# Patient Record
Sex: Male | Born: 1965 | Race: White | Hispanic: No | Marital: Married | Smoking: Current every day smoker
Health system: Southern US, Community
[De-identification: ages and names within clinical notes are randomized; demographics above are authoritative.]

## PROBLEM LIST (undated history)

## (undated) DIAGNOSIS — I10 Essential (primary) hypertension: Secondary | ICD-10-CM

## (undated) DIAGNOSIS — W3400XA Accidental discharge from unspecified firearms or gun, initial encounter: Secondary | ICD-10-CM

## (undated) HISTORY — PX: JOINT REPLACEMENT: SHX530

---

## 2004-11-25 ENCOUNTER — Emergency Department: Payer: Self-pay | Admitting: Emergency Medicine

## 2004-12-12 ENCOUNTER — Emergency Department: Payer: Self-pay | Admitting: Emergency Medicine

## 2005-07-29 ENCOUNTER — Emergency Department: Payer: Self-pay | Admitting: General Practice

## 2006-05-11 ENCOUNTER — Emergency Department: Payer: Self-pay | Admitting: Emergency Medicine

## 2006-09-14 ENCOUNTER — Emergency Department: Payer: Self-pay | Admitting: Emergency Medicine

## 2008-09-09 ENCOUNTER — Emergency Department: Payer: Self-pay | Admitting: Emergency Medicine

## 2009-06-16 ENCOUNTER — Emergency Department: Payer: Self-pay | Admitting: Emergency Medicine

## 2009-08-05 ENCOUNTER — Emergency Department: Payer: Self-pay | Admitting: Unknown Physician Specialty

## 2009-10-20 ENCOUNTER — Ambulatory Visit: Payer: Self-pay | Admitting: Family Medicine

## 2011-09-05 ENCOUNTER — Emergency Department: Payer: Self-pay | Admitting: Emergency Medicine

## 2011-09-05 LAB — CBC
MCH: 32.7 pg (ref 26.0–34.0)
MCV: 95 fL (ref 80–100)
Platelet: 160 10*3/uL (ref 150–440)
RDW: 12.6 % (ref 11.5–14.5)
WBC: 6.5 10*3/uL (ref 3.8–10.6)

## 2011-09-05 LAB — BASIC METABOLIC PANEL
Anion Gap: 3 — ABNORMAL LOW (ref 7–16)
Calcium, Total: 8.6 mg/dL (ref 8.5–10.1)
Co2: 29 mmol/L (ref 21–32)
EGFR (African American): >60
EGFR (Non-African Amer.): >60
Glucose: 96 mg/dL (ref 65–99)
Osmolality: 273 (ref 275–301)
Potassium: 4.1 mmol/L (ref 3.5–5.1)
Sodium: 137 mmol/L (ref 136–145)

## 2011-09-05 LAB — TROPONIN I: Troponin-I: <0.02 ng/mL

## 2011-12-29 ENCOUNTER — Emergency Department: Payer: Self-pay | Admitting: Emergency Medicine

## 2011-12-31 ENCOUNTER — Emergency Department: Payer: Self-pay | Admitting: Emergency Medicine

## 2011-12-31 LAB — BASIC METABOLIC PANEL
Calcium, Total: 9 mg/dL (ref 8.5–10.1)
Chloride: 106 mmol/L (ref 98–107)
Creatinine: 0.86 mg/dL (ref 0.60–1.30)
EGFR (Non-African Amer.): >60
Glucose: 88 mg/dL (ref 65–99)
Osmolality: 276 (ref 275–301)
Potassium: 4.2 mmol/L (ref 3.5–5.1)
Sodium: 138 mmol/L (ref 136–145)

## 2011-12-31 LAB — CBC WITH DIFFERENTIAL/PLATELET
Basophil %: 1.2 %
Eosinophil #: 0.3 10*3/uL (ref 0.0–0.7)
Eosinophil %: 3.9 %
HGB: 15.1 g/dL (ref 13.0–18.0)
Lymphocyte %: 35.9 %
MCH: 33.7 pg (ref 26.0–34.0)
Monocyte #: 0.5 x10 3/mm (ref 0.2–1.0)
Monocyte %: 6.6 %
Neutrophil %: 52.4 %
Platelet: 150 10*3/uL (ref 150–440)
RBC: 4.47 10*6/uL (ref 4.40–5.90)
RDW: 12.3 % (ref 11.5–14.5)
WBC: 7.6 10*3/uL (ref 3.8–10.6)

## 2012-01-01 ENCOUNTER — Emergency Department: Payer: Self-pay | Admitting: Emergency Medicine

## 2012-01-06 LAB — CULTURE, BLOOD (SINGLE)

## 2012-01-14 ENCOUNTER — Emergency Department: Payer: Self-pay | Admitting: Internal Medicine

## 2012-04-24 ENCOUNTER — Emergency Department: Payer: Self-pay | Admitting: Emergency Medicine

## 2012-04-24 LAB — COMPREHENSIVE METABOLIC PANEL
Albumin: 3.9 g/dL (ref 3.4–5.0)
Alkaline Phosphatase: 97 U/L (ref 50–136)
Co2: 27 mmol/L (ref 21–32)
Creatinine: 0.78 mg/dL (ref 0.60–1.30)
EGFR (African American): >60
Glucose: 96 mg/dL (ref 65–99)
Potassium: 3.9 mmol/L (ref 3.5–5.1)
SGOT(AST): 66 U/L — ABNORMAL HIGH (ref 15–37)
SGPT (ALT): 121 U/L — ABNORMAL HIGH (ref 12–78)
Sodium: 138 mmol/L (ref 136–145)
Total Protein: 7 g/dL (ref 6.4–8.2)

## 2012-04-24 LAB — CBC
HGB: 14.5 g/dL (ref 13.0–18.0)
MCHC: 34.3 g/dL (ref 32.0–36.0)
MCV: 94 fL (ref 80–100)
RBC: 4.51 10*6/uL (ref 4.40–5.90)
RDW: 12.1 % (ref 11.5–14.5)

## 2012-04-24 LAB — CK TOTAL AND CKMB (NOT AT ARMC)
CK, Total: 48 U/L (ref 35–232)
CK-MB: 0.8 ng/mL (ref 0.5–3.6)

## 2012-04-24 LAB — TROPONIN I: Troponin-I: <0.02 ng/mL

## 2012-04-24 LAB — PROTIME-INR: Prothrombin Time: 13.3 secs (ref 11.5–14.7)

## 2012-04-24 IMAGING — CR DG CHEST 2V
1 series · 2 of 2 positions shown · non-contrast
Comparison: none

REASON FOR EXAM: Chest Pain
COMMENTS:

[Series 1: w chest pa · 0.14mm/px · 2 of 2 slices shown]
[im 1/2]
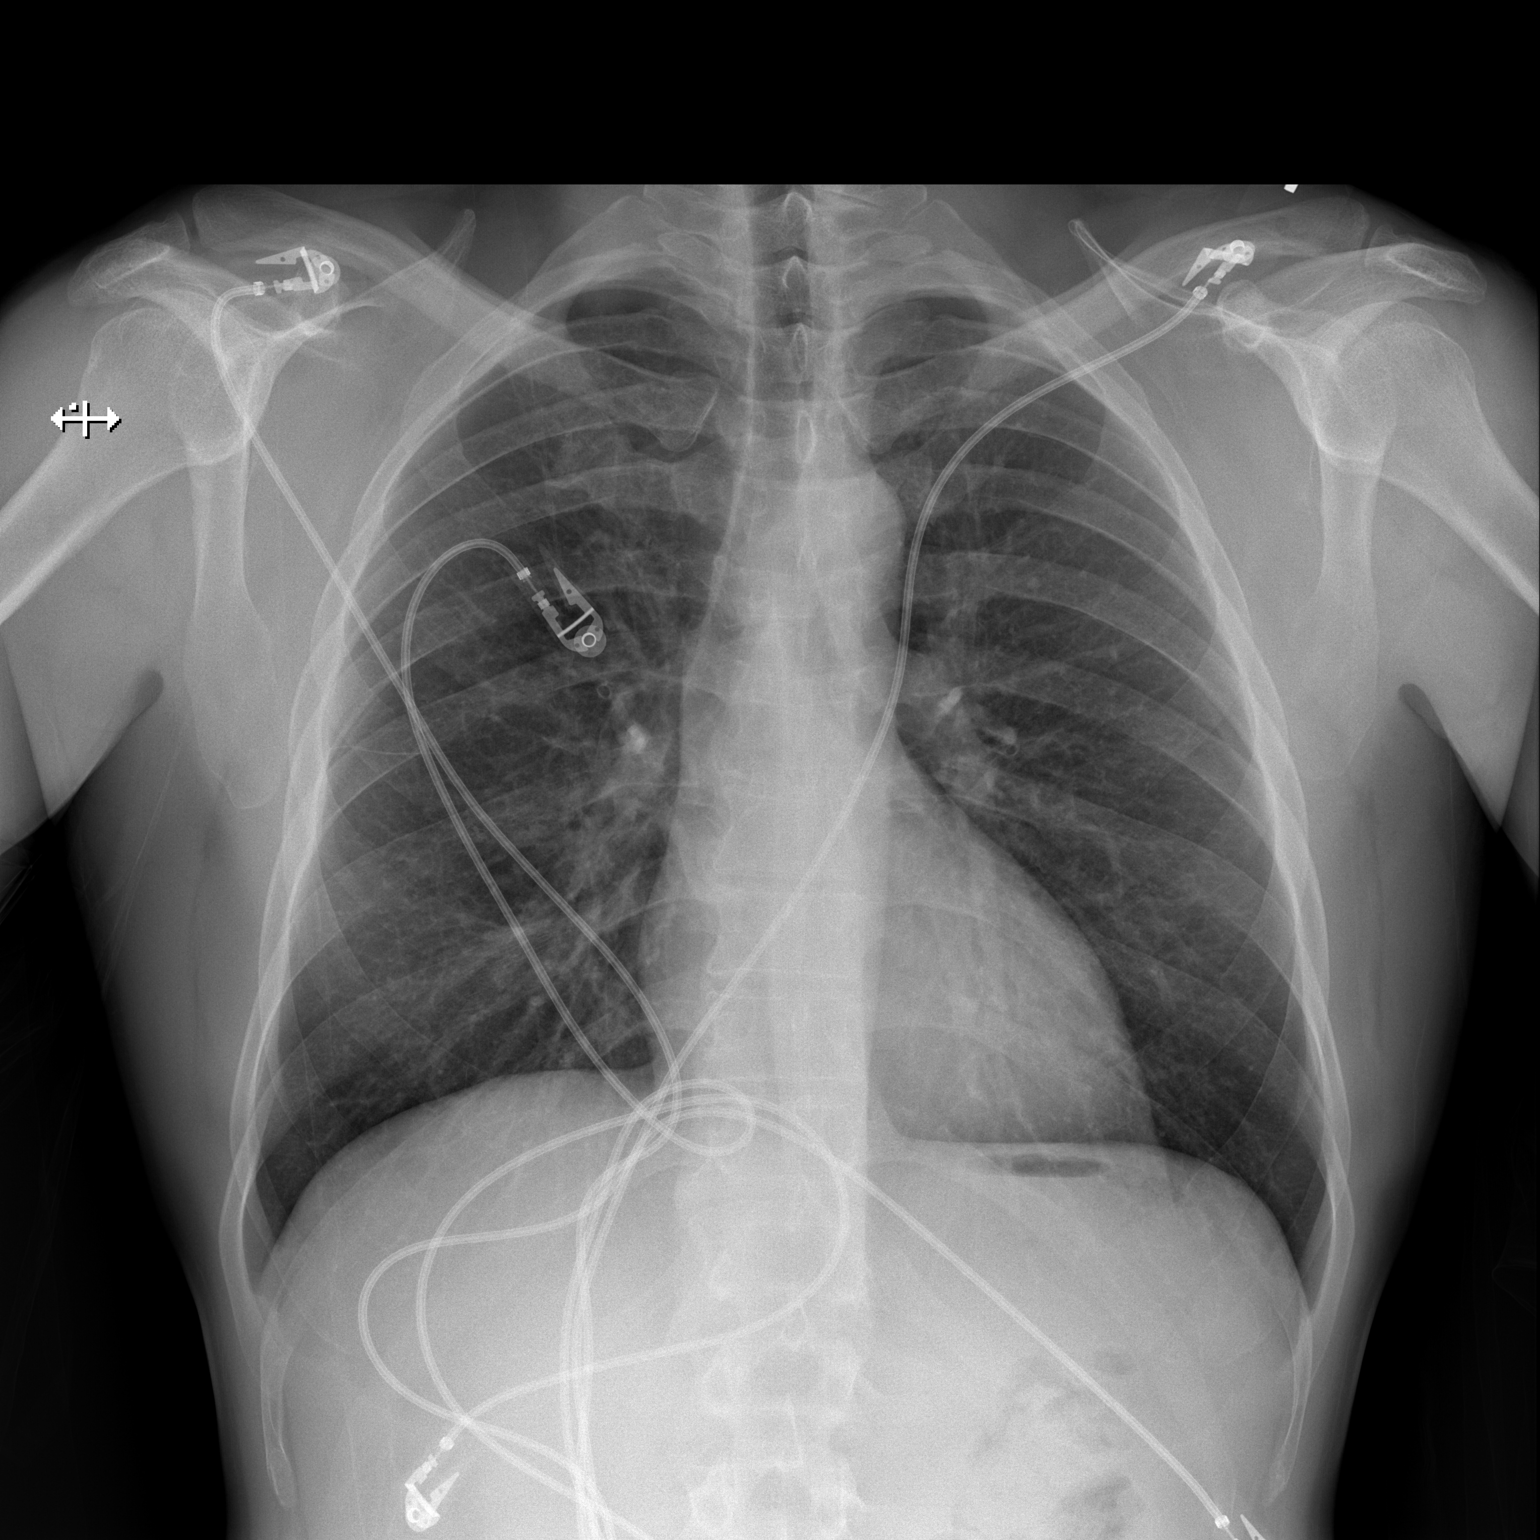
[im 2/2]
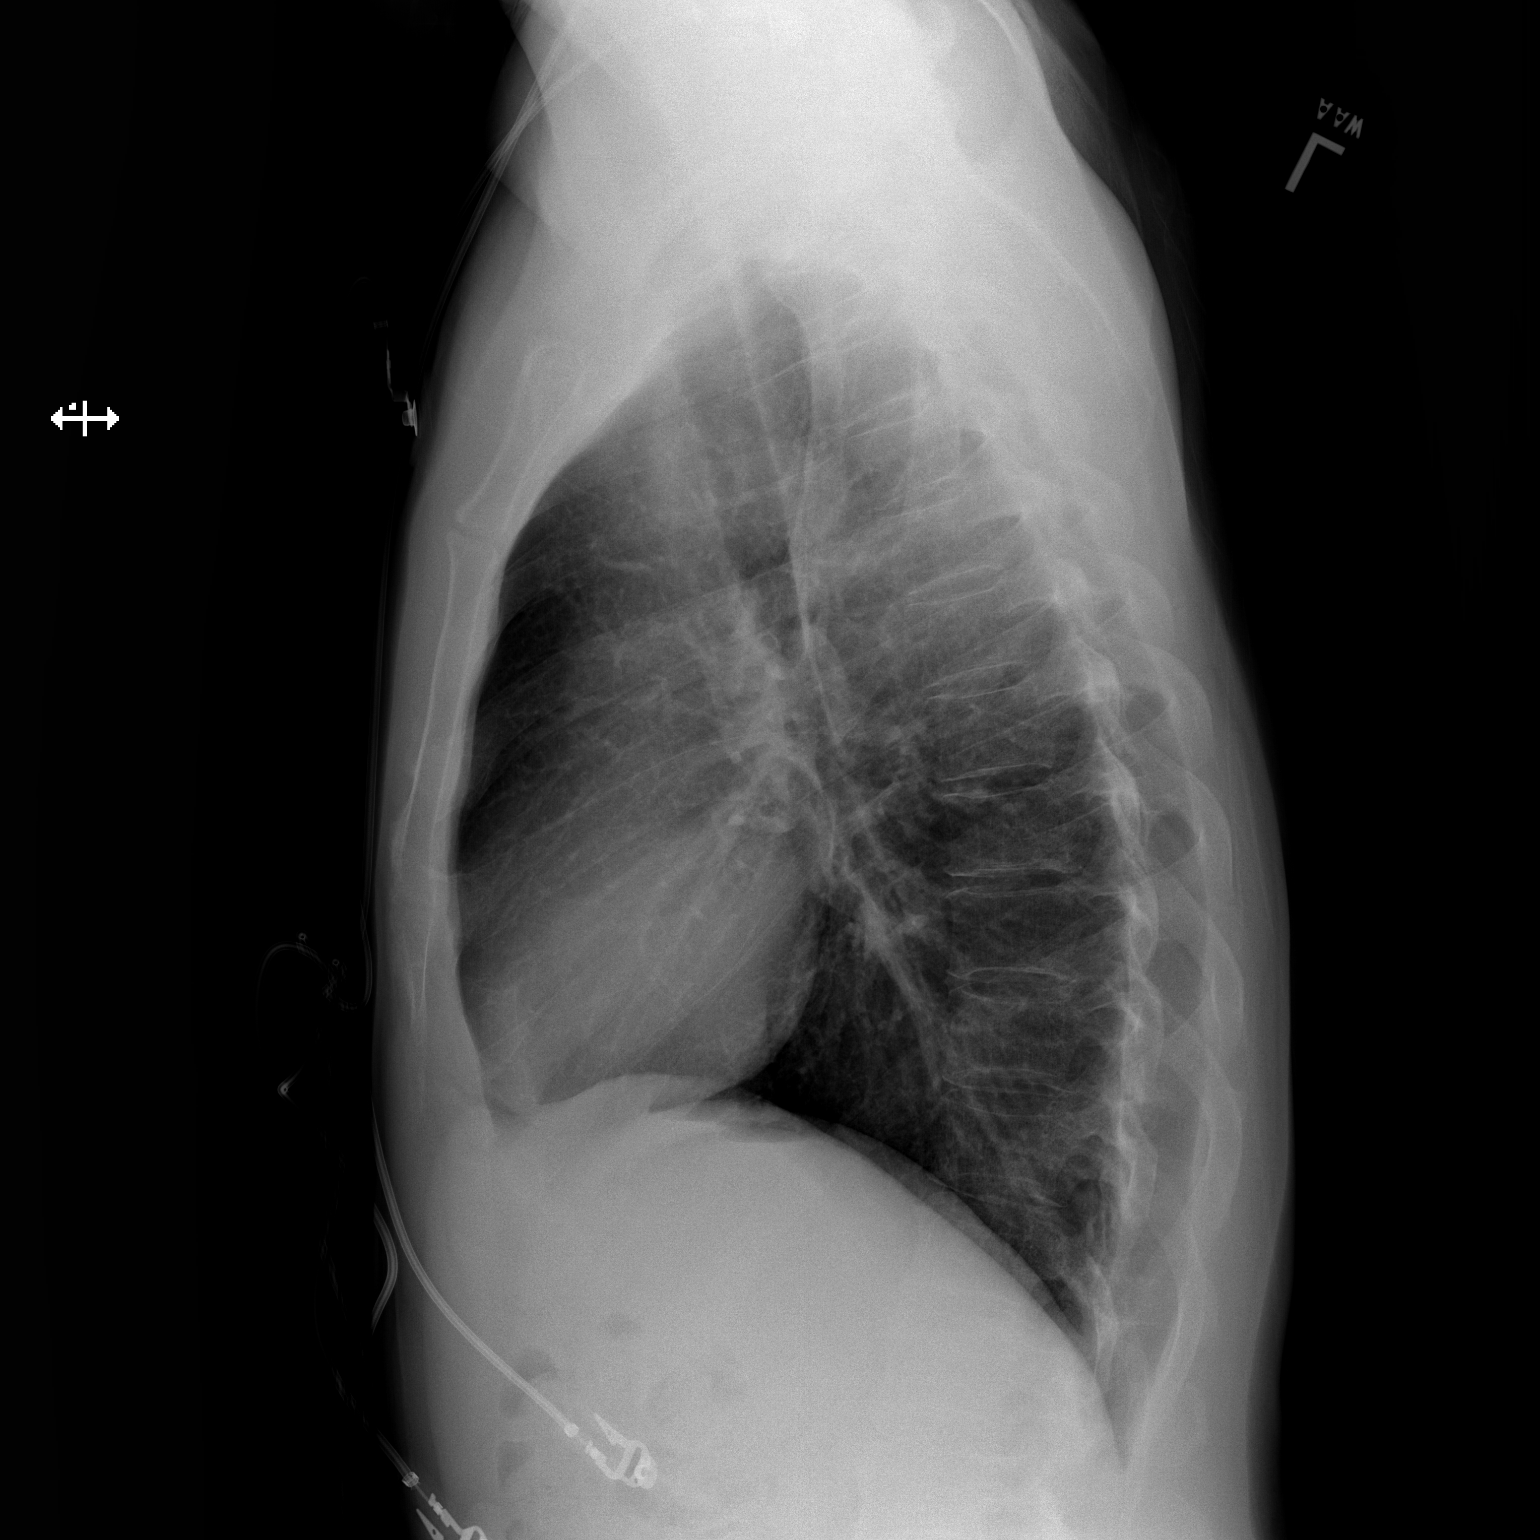

[2 of 2 positions shown; findings below may reference images not displayed]

PROCEDURE:     DXR - DXR CHEST PA (OR AP) AND LATERAL  - [DATE]  [DATE]

RESULT:     Comparison is made to the previous exam dated [DATE].

Cardiac monitoring electrodes present. The lungs are clear. The heart and
pulmonary vessels are normal. The bony and mediastinal structures are
unremarkable. There is no effusion. There is no pneumothorax or evidence of
congestive failure.
IMPRESSION: No acute cardiopulmonary disease.

[REDACTED]

## 2012-05-20 ENCOUNTER — Emergency Department: Payer: Self-pay | Admitting: Emergency Medicine

## 2012-05-20 LAB — CBC WITH DIFFERENTIAL/PLATELET
Basophil %: 0.7 %
Eosinophil #: 0.2 10*3/uL (ref 0.0–0.7)
HGB: 13.4 g/dL (ref 13.0–18.0)
Lymphocyte #: 1.4 10*3/uL (ref 1.0–3.6)
Lymphocyte %: 18.3 %
MCH: 31.9 pg (ref 26.0–34.0)
MCHC: 34.7 g/dL (ref 32.0–36.0)
MCV: 92 fL (ref 80–100)
Monocyte #: 0.6 x10 3/mm (ref 0.2–1.0)
Neutrophil #: 5.6 10*3/uL (ref 1.4–6.5)
Neutrophil %: 71.3 %
Platelet: 153 10*3/uL (ref 150–440)
RBC: 4.18 10*6/uL — ABNORMAL LOW (ref 4.40–5.90)

## 2013-08-11 ENCOUNTER — Emergency Department: Payer: Self-pay | Admitting: Internal Medicine

## 2013-08-23 ENCOUNTER — Emergency Department: Payer: Self-pay | Admitting: Emergency Medicine

## 2013-08-27 ENCOUNTER — Ambulatory Visit: Payer: Self-pay | Admitting: Gastroenterology

## 2013-08-28 ENCOUNTER — Emergency Department: Payer: Self-pay | Admitting: Emergency Medicine

## 2013-10-10 ENCOUNTER — Emergency Department: Payer: Self-pay | Admitting: Emergency Medicine

## 2013-12-31 ENCOUNTER — Ambulatory Visit: Payer: Self-pay | Admitting: Urgent Care

## 2013-12-31 LAB — HEPATIC FUNCTION PANEL A (ARMC)
ALBUMIN: 3.9 g/dL (ref 3.4–5.0)
ALK PHOS: 132 U/L — AB
BILIRUBIN TOTAL: 0.3 mg/dL (ref 0.2–1.0)
Bilirubin, Direct: <0.1 mg/dL (ref 0.0–0.2)
SGOT(AST): 12 U/L — ABNORMAL LOW (ref 15–37)
SGPT (ALT): 15 U/L
TOTAL PROTEIN: 7.3 g/dL (ref 6.4–8.2)

## 2015-05-13 ENCOUNTER — Emergency Department
Admission: EM | Admit: 2015-05-13 | Discharge: 2015-05-14 | Disposition: A | Discharge status: Home / Self Care | Payer: Managed Care, Other (non HMO) | Attending: Emergency Medicine | Admitting: Emergency Medicine

## 2015-05-13 ENCOUNTER — Encounter: Payer: Self-pay | Admitting: *Deleted

## 2015-05-13 DIAGNOSIS — R1013 Epigastric pain: Secondary | ICD-10-CM | POA: Diagnosis present

## 2015-05-13 DIAGNOSIS — K859 Acute pancreatitis without necrosis or infection, unspecified: Secondary | ICD-10-CM | POA: Diagnosis not present

## 2015-05-13 LAB — CBC
HCT: 37.3 % — ABNORMAL LOW (ref 40.0–52.0)
HEMOGLOBIN: 12.9 g/dL — AB (ref 13.0–18.0)
MCH: 31.9 pg (ref 26.0–34.0)
MCHC: 34.7 g/dL (ref 32.0–36.0)
MCV: 91.8 fL (ref 80.0–100.0)
PLATELETS: 133 10*3/uL — AB (ref 150–440)
RBC: 4.06 MIL/uL — ABNORMAL LOW (ref 4.40–5.90)
RDW: 13.1 % (ref 11.5–14.5)
WBC: 5.6 10*3/uL (ref 3.8–10.6)

## 2015-05-13 LAB — URINALYSIS COMPLETE WITH MICROSCOPIC (ARMC ONLY)
BILIRUBIN URINE: NEGATIVE
Bacteria, UA: NONE SEEN
GLUCOSE, UA: NEGATIVE mg/dL
Hgb urine dipstick: NEGATIVE
KETONES UR: NEGATIVE mg/dL
Leukocytes, UA: NEGATIVE
NITRITE: NEGATIVE
PH: 5 (ref 5.0–8.0)
Protein, ur: NEGATIVE mg/dL
Specific Gravity, Urine: 1.027 (ref 1.005–1.030)
Squamous Epithelial / LPF: NONE SEEN

## 2015-05-13 LAB — COMPREHENSIVE METABOLIC PANEL
ALBUMIN: 4.2 g/dL (ref 3.5–5.0)
ALT: 12 U/L — ABNORMAL LOW (ref 17–63)
ANION GAP: 6 (ref 5–15)
AST: 20 U/L (ref 15–41)
Alkaline Phosphatase: 79 U/L (ref 38–126)
BILIRUBIN TOTAL: 0.6 mg/dL (ref 0.3–1.2)
BUN: 26 mg/dL — ABNORMAL HIGH (ref 6–20)
CALCIUM: 9.4 mg/dL (ref 8.9–10.3)
CO2: 30 mmol/L (ref 22–32)
CREATININE: 1.08 mg/dL (ref 0.61–1.24)
Chloride: 101 mmol/L (ref 101–111)
GFR calc Af Amer: >60 mL/min (ref >60)
GFR calc non Af Amer: >60 mL/min (ref >60)
GLUCOSE: 113 mg/dL — AB (ref 65–99)
POTASSIUM: 5.3 mmol/L — AB (ref 3.5–5.1)
SODIUM: 137 mmol/L (ref 135–145)
TOTAL PROTEIN: 6.8 g/dL (ref 6.5–8.1)

## 2015-05-13 LAB — TROPONIN I: Troponin I: <0.03 ng/mL (ref <0.031)

## 2015-05-13 LAB — LIPASE, BLOOD: Lipase: 64 U/L — ABNORMAL HIGH (ref 11–51)

## 2015-05-13 NOTE — ED Provider Notes (Signed)
[facility] Emergency Department Provider Note  ____________________________________________  Time seen: Approximately 11:53 PM  I have reviewed the triage vital signs and the nursing notes.   HISTORY  Chief Complaint Abdominal Pain and Diarrhea    HPI Devin RichardsGary P Lewis is a 50 y.o. male who presents to the ED from home with a chief complaint of abdominal pain. Patient reports sharp, nonradiating epigastric discomfort onset today approximately 2 PM after eating pizza. Symptoms associated with nausea, diarrhea but no vomiting. Denies history of same. Spouse states patient has a history of heavy alcohol use. Denies recent fever, chills, chest pain, shortness of breath. Denies recent travel or trauma. Nothing makes this pain better or worse.   Past medical history None  There are no active problems to display for this patient.   Past surgical history None for cholecystectomy  No current outpatient prescriptions on file.  Allergies Review of patient's allergies indicates no known allergies.  No family history on file.  Social History Social History  Substance Use Topics  . Smoking status: Never Smoker   . Smokeless tobacco: Not on file  . Alcohol Use: No  +Etoh  Review of Systems  Constitutional: No fever/chills. Eyes: No visual changes. ENT: No sore throat. Cardiovascular: Denies chest pain. Respiratory: Denies shortness of breath. Gastrointestinal: Positive for abdominal pain. Positive for nausea, no vomiting.  Positive for diarrhea.  No constipation. Genitourinary: Negative for dysuria. Musculoskeletal: Negative for back pain. Skin: Negative for rash. Neurological: Negative for headaches, focal weakness or numbness.  10-point ROS otherwise negative.  ____________________________________________   PHYSICAL EXAM:  VITAL SIGNS: ED Triage Vitals  Enc Vitals Group     BP 05/13/15 2053 118/72 mmHg     Pulse Rate 05/13/15 2053 70   Resp 05/13/15 2053 20     Temp 05/13/15 2053 97.6 F (36.4 C)     Temp Source 05/13/15 2053 Oral     SpO2 05/13/15 2053 99 %     Weight 05/13/15 2053 135 lb (61.236 kg)     Height 05/13/15 2053 5\' 5"  (1.651 m)     Head Cir --      Peak Flow --      Pain Score 05/13/15 2054 10     Pain Loc --      Pain Edu? --      Excl. in GC? --     Constitutional: Asleep, easily awakened for exam. Alert and oriented. Well appearing and in no acute distress. Eyes: Conjunctivae are normal. PERRL. EOMI. Head: Atraumatic. Nose: No congestion/rhinnorhea. Mouth/Throat: Mucous membranes are moist.  Oropharynx non-erythematous. Neck: No stridor.   Cardiovascular: Normal rate, regular rhythm. Grossly normal heart sounds.  Good peripheral circulation. Respiratory: Normal respiratory effort.  No retractions. Lungs CTAB. Gastrointestinal: Soft and mildly tender to palpation epigastrium without rebound or guarding. No distention. No abdominal bruits. No CVA tenderness. Musculoskeletal: No lower extremity tenderness nor edema.  No joint effusions. Neurologic:  Normal speech and language. No gross focal neurologic deficits are appreciated. No gait instability. Skin:  Skin is warm, dry and intact. No rash noted. Psychiatric: Mood and affect are normal. Speech and behavior are normal.  ____________________________________________   LABS (all labs ordered are listed, but only abnormal results are displayed)  Labs Reviewed  LIPASE, BLOOD - Abnormal; Notable for the following:    Lipase 64 (*)    All other components within normal limits  COMPREHENSIVE METABOLIC PANEL - Abnormal; Notable for the following:    Potassium 5.3 (*)  Glucose, Bld 113 (*)    BUN 26 (*)    ALT 12 (*)    All other components within normal limits  CBC - Abnormal; Notable for the following:    RBC 4.06 (*)    Hemoglobin 12.9 (*)    HCT 37.3 (*)    Platelets 133 (*)    All other components within normal limits  URINALYSIS  COMPLETEWITH MICROSCOPIC ([facility] ONLY) - Abnormal; Notable for the following:    Color, Urine YELLOW (*)    APPearance CLEAR (*)    All other components within normal limits  TROPONIN I  ETHANOL   ____________________________________________  EKG  ED ECG REPORT I, Verl Whitmore J, the attending physician, personally viewed and interpreted this ECG.   Date: 05/14/2015  EKG Time: 2058  Rate: 67  Rhythm: normal EKG, normal sinus rhythm  Axis: Normal  Intervals:none  ST&T Change: Nonspecific  ____________________________________________  RADIOLOGY  Ultrasound abdomen interpreted per Dr. Andria Meuse: Normal examination. ____________________________________________   PROCEDURES  Procedure(s) performed: None  Critical Care performed: No  ____________________________________________   INITIAL IMPRESSION / ASSESSMENT AND PLAN / ED COURSE  Pertinent labs & imaging results that were available during my care of the patient were reviewed by me and considered in my medical decision making (see chart for details).  50 year old male who presents with epigastric discomfort in the setting of pizza and alcohol use. Laboratory and urinalysis results remarkable for mildly elevated lipase and mild hyperkalemia without EKG changes. Will initiate IV fluid resuscitation, IV analgesia and antiemetic; obtain limited RUQ ultrasound to evaluate for cholecystitis.  ----------------------------------------- 1:08 AM on 05/14/2015 -----------------------------------------  Patient sleeping. Awakened to update of negative imaging studies. Will manage mild pancreatitis with analgesia and antiemetic as needed; follow-up with his PCP. Strict return precautions given. Patient spouse verbalize understanding and agree with plan of care. ____________________________________________   FINAL CLINICAL IMPRESSION(S) / ED DIAGNOSES  Final diagnoses:  Epigastric pain  Acute pancreatitis, unspecified pancreatitis  type      Irean Hong, MD 05/14/15 [phone]

## 2015-05-13 NOTE — ED Notes (Signed)
Pt in with co upper abd pain since today, no vomiting but does have some diarrhea.

## 2015-05-14 ENCOUNTER — Encounter: Payer: Self-pay | Admitting: *Deleted

## 2015-05-14 ENCOUNTER — Emergency Department: Payer: Managed Care, Other (non HMO)

## 2015-05-14 ENCOUNTER — Emergency Department
Admission: EM | Admit: 2015-05-14 | Discharge: 2015-05-14 | Disposition: A | Discharge status: Home / Self Care | Payer: Managed Care, Other (non HMO) | Source: Home / Self Care | Attending: Emergency Medicine | Admitting: Emergency Medicine

## 2015-05-14 DIAGNOSIS — F17200 Nicotine dependence, unspecified, uncomplicated: Secondary | ICD-10-CM | POA: Insufficient documentation

## 2015-05-14 DIAGNOSIS — R1013 Epigastric pain: Secondary | ICD-10-CM

## 2015-05-14 DIAGNOSIS — K2970 Gastritis, unspecified, without bleeding: Secondary | ICD-10-CM

## 2015-05-14 LAB — COMPREHENSIVE METABOLIC PANEL
ALT: 12 U/L — AB (ref 17–63)
AST: 19 U/L (ref 15–41)
Albumin: 4.2 g/dL (ref 3.5–5.0)
Alkaline Phosphatase: 73 U/L (ref 38–126)
Anion gap: 7 (ref 5–15)
BILIRUBIN TOTAL: 0.5 mg/dL (ref 0.3–1.2)
BUN: 24 mg/dL — AB (ref 6–20)
CO2: 29 mmol/L (ref 22–32)
CREATININE: 1.11 mg/dL (ref 0.61–1.24)
Calcium: 9 mg/dL (ref 8.9–10.3)
Chloride: 104 mmol/L (ref 101–111)
Glucose, Bld: 82 mg/dL (ref 65–99)
POTASSIUM: 4.7 mmol/L (ref 3.5–5.1)
Sodium: 140 mmol/L (ref 135–145)
Total Protein: 6.8 g/dL (ref 6.5–8.1)

## 2015-05-14 LAB — CBC
HEMATOCRIT: 39.3 % — AB (ref 40.0–52.0)
Hemoglobin: 13.5 g/dL (ref 13.0–18.0)
MCH: 32 pg (ref 26.0–34.0)
MCHC: 34.4 g/dL (ref 32.0–36.0)
MCV: 93.2 fL (ref 80.0–100.0)
Platelets: 140 10*3/uL — ABNORMAL LOW (ref 150–440)
RBC: 4.22 MIL/uL — ABNORMAL LOW (ref 4.40–5.90)
RDW: 12.6 % (ref 11.5–14.5)
WBC: 6.7 10*3/uL (ref 3.8–10.6)

## 2015-05-14 LAB — ETHANOL

## 2015-05-14 LAB — LIPASE, BLOOD: Lipase: 22 U/L (ref 11–51)

## 2015-05-14 MED ORDER — ONDANSETRON HCL 4 MG/2ML IJ SOLN
4.0000 mg | Freq: Once | INTRAMUSCULAR | Status: AC
  Administered 2015-05-14: 4 mg via INTRAVENOUS
  Filled 2015-05-14: qty 2

## 2015-05-14 MED ORDER — SODIUM CHLORIDE 0.9 % IV BOLUS (SEPSIS)
1000.0000 mL | Freq: Once | INTRAVENOUS | Status: AC
  Administered 2015-05-14: 1000 mL via INTRAVENOUS

## 2015-05-14 MED ORDER — ONDANSETRON 4 MG PO TBDP: 4.0000 mg | ORAL_TABLET | Freq: Three times a day (TID) | ORAL | Status: DC | PRN

## 2015-05-14 MED ORDER — PANTOPRAZOLE SODIUM 40 MG PO TBEC
80.0000 mg | DELAYED_RELEASE_TABLET | Freq: Once | ORAL | Status: AC
  Administered 2015-05-14: 80 mg via ORAL
  Filled 2015-05-14: qty 2

## 2015-05-14 MED ORDER — GI COCKTAIL ~~LOC~~
30.0000 mL | Freq: Once | ORAL | Status: AC
  Administered 2015-05-14: 30 mL via ORAL
  Filled 2015-05-14: qty 30

## 2015-05-14 MED ORDER — SODIUM CHLORIDE 0.9 % IV BOLUS (SEPSIS)
500.0000 mL | Freq: Once | INTRAVENOUS | Status: AC
  Administered 2015-05-14: 500 mL via INTRAVENOUS

## 2015-05-14 MED ORDER — OXYCODONE-ACETAMINOPHEN 5-325 MG PO TABS: 1.0000 | ORAL_TABLET | ORAL | Status: DC | PRN

## 2015-05-14 MED ORDER — PANTOPRAZOLE SODIUM 20 MG PO TBEC: 20.0000 mg | DELAYED_RELEASE_TABLET | Freq: Every day | ORAL | Status: DC

## 2015-05-14 MED ORDER — MORPHINE SULFATE (PF) 4 MG/ML IV SOLN
4.0000 mg | Freq: Once | INTRAVENOUS | Status: AC
  Administered 2015-05-14: 2 mg via INTRAVENOUS
  Filled 2015-05-14: qty 1

## 2015-05-14 NOTE — ED Notes (Signed)
Pt was seen in er last night and dx with pancreatitis. Pt states pain is no better and pain meds are not helping.  Pt reports v/d today.  Pt alert.

## 2015-05-14 NOTE — ED Notes (Signed)
Pt returned from scans via stretcher.  

## 2015-05-14 NOTE — ED Provider Notes (Signed)
Time Seen: Approximately 2123 I have reviewed the triage notes  Chief Complaint: Abdominal Pain   History of Present Illness: Devin Lewis is a 50 y.o. male who presents with persistent epigastric abdominal pain. Patient was seen 2 days ago here in emergency department had recent history of alcohol usage and had an elevated lipase. He was diagnosed with pancreatitis and discharged with pain medication. Patient states that he is taking the medication nose had increased abdominal pain. He exclusively points to the epigastric area and denies any lower abdominal pain. Eyes any melena or hematochezia. He states he's abraded alcohol consumption.   No past medical history on file.  There are no active problems to display for this patient.   No past surgical history on file.  No past surgical history on file.  Current Outpatient Rx  Name  Route  Sig  Dispense  Refill  . ondansetron (ZOFRAN ODT) 4 MG disintegrating tablet   Oral   Take 1 tablet (4 mg total) by mouth every 8 (eight) hours as needed for nausea or vomiting.   15 tablet   0   . oxyCODONE-acetaminophen (ROXICET) 5-325 MG tablet   Oral   Take 1 tablet by mouth every 4 (four) hours as needed for severe pain.   15 tablet   0     Allergies:  Review of patient's allergies indicates no known allergies.  Family History: No family history on file.  Social History: Social History  Substance Use Topics  . Smoking status: Current Every Day Smoker  . Smokeless tobacco: None  . Alcohol Use: Yes     Review of Systems:   10 point review of systems was performed and was otherwise negative:  Constitutional: No fever Eyes: No visual disturbances ENT: No sore throat, ear pain Cardiac: No chest pain Respiratory: No shortness of breath, wheezing, or stridor Abdomen: Epigastric abdominal pain, no vomiting, no melena or hematochezia Endocrine: No weight loss, No night sweats Extremities: No peripheral edema,  cyanosis Skin: No rashes, easy bruising Neurologic: No focal weakness, trouble with speech or swollowing Urologic: No dysuria, Hematuria, or urinary frequency   Physical Exam:  ED Triage Vitals  Enc Vitals Group     BP 05/14/15 1722 100/75 mmHg     Pulse Rate 05/14/15 1722 99     Resp 05/14/15 1722 18     Temp 05/14/15 1722 98.4 F (36.9 C)     Temp Source 05/14/15 1722 Oral     SpO2 05/14/15 1722 97 %     Weight 05/14/15 1722 135 lb (61.236 kg)     Height 05/14/15 1722 5\' 5"  (1.651 m)     Head Cir --      Peak Flow --      Pain Score 05/14/15 1722 10     Pain Loc --      Pain Edu? --      Excl. in GC? --     General: Awake , Alert , and Oriented times 3; GCS 15 Head: Normal cephalic , atraumatic Eyes: Pupils equal , round, reactive to light Nose/Throat: No nasal drainage, patent upper airway without erythema or exudate.  Neck: Supple, Full range of motion, No anterior adenopathy or palpable thyroid masses Lungs: Clear to ascultation without wheezes , rhonchi, or rales Heart: Regular rate, regular rhythm without murmurs , gallops , or rubs Abdomen: Tender exclusively in the epigastric area with a negative Murphy's sign and no rebound, guarding or rigidity. No focal tenderness over  McBurney's point. Extremities: 2 plus symmetric pulses. No edema, clubbing or cyanosis Neurologic: normal ambulation, Motor symmetric without deficits, sensory intact Skin: warm, dry, no rashes   Labs:   All laboratory work was reviewed including any pertinent negatives or positives listed below:  Labs Reviewed  COMPREHENSIVE METABOLIC PANEL - Abnormal; Notable for the following:    BUN 24 (*)    ALT 12 (*)    All other components within normal limits  CBC - Abnormal; Notable for the following:    RBC 4.22 (*)    HCT 39.3 (*)    Platelets 140 (*)    All other components within normal limits  LIPASE, BLOOD   Reviewed the patient's laboratory work shows no significant findings. Stable  hemoglobin Radiology:      CLINICAL DATA: Diagnosed with pancreatitis last night. Persistent pain, vomiting, and diarrhea.  EXAM: DG ABDOMEN ACUTE W/ 1V CHEST  COMPARISON: 04/24/2012  FINDINGS: Normal heart size and pulmonary vascularity. No focal airspace disease or consolidation in the lungs. No blunting of costophrenic angles. No pneumothorax. Mediastinal contours appear intact.  Scattered gas and stool in the colon. No small or large bowel distention. No free intra-abdominal air. No abnormal air-fluid levels. No radiopaque stones. Postoperative changes in the left hip.  IMPRESSION: Negative abdominal radiographs. No acute cardiopulmonary disease.   Electronically Signed By: Burman Nieves M.D.  I personally reviewed the radiologic studies   P ED Course:  Patient's differential included pancreatitis, however his lipase seems to be decreasing at this time. The patient's differential also includes gastritis, duodenal ulcer disease, etc. Patient's hemoglobin is stable and denies any melena or hematochezia, etc.   Assessment: * Acute epigastric abdominal pain   Final Clinical Impression:   Final diagnoses:  Epigastric abdominal pain     Plan:  Outpatient management Patient was advised to return immediately if condition worsens. Patient was advised to follow up with their primary care physician or other specialized physicians involved in their outpatient care             Jennye Moccasin, MD 05/14/15 2339

## 2015-05-14 NOTE — Discharge Instructions (Signed)
Gastritis, Adult °Gastritis is soreness and swelling (inflammation) of the lining of the stomach. Gastritis can develop as a sudden onset (acute) or long-term (chronic) condition. If gastritis is not treated, it can lead to stomach bleeding and ulcers. °CAUSES  °Gastritis occurs when the stomach lining is weak or damaged. Digestive juices from the stomach then inflame the weakened stomach lining. The stomach lining may be weak or damaged due to viral or bacterial infections. One common bacterial infection is the Helicobacter pylori infection. Gastritis can also result from excessive alcohol consumption, taking certain medicines, or having too much acid in the stomach.  °SYMPTOMS  °In some cases, there are no symptoms. When symptoms are present, they may include: °· Pain or a burning sensation in the upper abdomen. °· Nausea. °· Vomiting. °· An uncomfortable feeling of fullness after eating. °DIAGNOSIS  °Your caregiver may suspect you have gastritis based on your symptoms and a physical exam. To determine the cause of your gastritis, your caregiver may perform the following: °· Blood or stool tests to check for the H pylori bacterium. °· Gastroscopy. A thin, flexible tube (endoscope) is passed down the esophagus and into the stomach. The endoscope has a light and camera on the end. Your caregiver uses the endoscope to view the inside of the stomach. °· Taking a tissue sample (biopsy) from the stomach to examine under a microscope. °TREATMENT  °Depending on the cause of your gastritis, medicines may be prescribed. If you have a bacterial infection, such as an H pylori infection, antibiotics may be given. If your gastritis is caused by too much acid in the stomach, H2 blockers or antacids may be given. Your caregiver may recommend that you stop taking aspirin, ibuprofen, or other nonsteroidal anti-inflammatory drugs (NSAIDs). °HOME CARE INSTRUCTIONS °· Only take over-the-counter or prescription medicines as directed by  your caregiver. °· If you were given antibiotic medicines, take them as directed. Finish them even if you start to feel better. °· Drink enough fluids to keep your urine clear or pale yellow. °· Avoid foods and drinks that make your symptoms worse, such as: °¨ Caffeine or alcoholic drinks. °¨ Chocolate. °¨ Peppermint or mint flavorings. °¨ Garlic and onions. °¨ Spicy foods. °¨ Citrus fruits, such as oranges, lemons, or limes. °¨ Tomato-based foods such as sauce, chili, salsa, and pizza. °¨ Fried and fatty foods. °· Eat small, frequent meals instead of large meals. °SEEK IMMEDIATE MEDICAL CARE IF:  °· You have black or dark red stools. °· You vomit blood or material that looks like coffee grounds. °· You are unable to keep fluids down. °· Your abdominal pain gets worse. °· You have a fever. °· You do not feel better after 1 week. °· You have any other questions or concerns. °MAKE SURE YOU: °· Understand these instructions. °· Will watch your condition. °· Will get help right away if you are not doing well or get worse. °  °This information is not intended to replace advice given to you by your health care provider. Make sure you discuss any questions you have with your health care provider. °  °Document Released: 02/16/2001 Document Revised: 08/24/2011 Document Reviewed: 04/07/2011 °Elsevier Interactive Patient Education ©2016 Elsevier Inc. ° ° °Please return immediately if condition worsens. Please contact her primary physician or the physician you were given for referral. If you have any specialist physicians involved in her treatment and plan please also contact them. Thank you for using [facility]. ° °

## 2015-05-14 NOTE — ED Notes (Signed)
Discussed IV options with patient, AC placement preferred. 

## 2015-05-14 NOTE — Discharge Instructions (Signed)
1. You may take pain & nausea medicines as needed (Percocet/Zofran #15). 2. Parke Simmers diet as tolerated x 5 days, then slowly advance diet as tolerated. 3. Return to the ER for worsening symptoms, persistent vomiting, fever or other concerns.  Abdominal Pain, Adult Many things can cause abdominal pain. Usually, abdominal pain is not caused by a disease and will improve without treatment. It can often be observed and treated at home. Your health care provider will do a physical exam and possibly order blood tests and X-rays to help determine the seriousness of your pain. However, in many cases, more time must pass before a clear cause of the pain can be found. Before that point, your health care provider may not know if you need more testing or further treatment. HOME CARE INSTRUCTIONS Monitor your abdominal pain for any changes. The following actions may help to alleviate any discomfort you are experiencing:  Only take over-the-counter or prescription medicines as directed by your health care provider.  Do not take laxatives unless directed to do so by your health care provider.  Try a clear liquid diet (broth, tea, or water) as directed by your health care provider. Slowly move to a bland diet as tolerated. SEEK MEDICAL CARE IF:  You have unexplained abdominal pain.  You have abdominal pain associated with nausea or diarrhea.  You have pain when you urinate or have a bowel movement.  You experience abdominal pain that wakes you in the night.  You have abdominal pain that is worsened or improved by eating food.  You have abdominal pain that is worsened with eating fatty foods.  You have a fever. SEEK IMMEDIATE MEDICAL CARE IF:  Your pain does not go away within 2 hours.  You keep throwing up (vomiting).  Your pain is felt only in portions of the abdomen, such as the right side or the left lower portion of the abdomen.  You pass bloody or black tarry stools. MAKE SURE  YOU:  Understand these instructions.  Will watch your condition.  Will get help right away if you are not doing well or get worse.   This information is not intended to replace advice given to you by your health care provider. Make sure you discuss any questions you have with your health care provider.   Document Released: 12/02/2004 Document Revised: 11/13/2014 Document Reviewed: 11/01/2012 Elsevier Interactive Patient Education 2016 Else[employer] Inc.  Acute Pancreatitis Acute pancreatitis is a disease in which the pancreas becomes suddenly inflamed. The pancreas is a large gland located behind your stomach. The pancreas produces enzymes that help digest food. The pancreas also releases the hormones glucagon and insulin that help regulate blood sugar. Damage to the pancreas occurs when the digestive enzymes from the pancreas are activated and begin attacking the pancreas before being released into the intestine. Most acute attacks last a couple of days and can cause serious complications. Some people become dehydrated and develop low blood pressure. In severe cases, bleeding into the pancreas can lead to shock and can be life-threatening. The lungs, heart, and kidneys may fail. CAUSES  Pancreatitis can happen to anyone. In some cases, the cause is unknown. Most cases are caused by:  Alcohol abuse.  Gallstones. Other less common causes are:  Certain medicines.  Exposure to certain chemicals.  Infection.  Damage caused by an accident (trauma).  Abdominal surgery. SYMPTOMS   Pain in the upper abdomen that may radiate to the back.  Tenderness and swelling of the abdomen.  Nausea  and vomiting. DIAGNOSIS  Your caregiver will perform a physical exam. Blood and stool tests may be done to confirm the diagnosis. Imaging tests may also be done, such as X-rays, CT scans, or an ultrasound of the abdomen. TREATMENT  Treatment usually requires a stay in the hospital. Treatment may  include:  Pain medicine.  Fluid replacement through an intravenous line (IV).  Placing a tube in the stomach to remove stomach contents and control vomiting.  Not eating for 3 or 4 days. This gives your pancreas a rest, because enzymes are not being produced that can cause further damage.  Antibiotic medicines if your condition is caused by an infection.  Surgery of the pancreas or gallbladder. HOME CARE INSTRUCTIONS   Follow the diet advised by your caregiver. This may involve avoiding alcohol and decreasing the amount of fat in your diet.  Eat smaller, more frequent meals. This reduces the amount of digestive juices the pancreas produces.  Drink enough fluids to keep your urine clear or pale yellow.  Only take over-the-counter or prescription medicines as directed by your caregiver.  Avoid drinking alcohol if it caused your condition.  Do not smoke.  Get plenty of rest.  Check your blood sugar at home as directed by your caregiver.  Keep all follow-up appointments as directed by your caregiver. SEEK MEDICAL CARE IF:   You do not recover as quickly as expected.  You develop new or worsening symptoms.  You have persistent pain, weakness, or nausea.  You recover and then have another episode of pain. SEEK IMMEDIATE MEDICAL CARE IF:   You are unable to eat or keep fluids down.  Your pain becomes severe.  You have a fever or persistent symptoms for more than 2 to 3 days.  You have a fever and your symptoms suddenly get worse.  Your skin or the white part of your eyes turn yellow (jaundice).  You develop vomiting.  You feel dizzy, or you faint.  Your blood sugar is high (over 300 mg/dL). MAKE SURE YOU:   Understand these instructions.  Will watch your condition.  Will get help right away if you are not doing well or get worse.   This information is not intended to replace advice given to you by your health care provider. Make sure you discuss any  questions you have with your health care provider.   Document Released: 02/22/2005 Document Revised: 08/24/2011 Document Reviewed: 06/03/2011 Elsevier Interactive Patient Education [employer]2016 Elsevier Inc.

## 2015-05-14 NOTE — ED Notes (Signed)
Dr. Quigley at bedside.  

## 2015-05-14 NOTE — ED Notes (Signed)
Patient transported to Ultrasound 

## 2015-08-15 ENCOUNTER — Encounter: Payer: Self-pay | Admitting: *Deleted

## 2015-08-15 ENCOUNTER — Emergency Department: Payer: Managed Care, Other (non HMO)

## 2015-08-15 ENCOUNTER — Emergency Department
Admission: EM | Admit: 2015-08-15 | Discharge: 2015-08-16 | Disposition: A | Discharge status: Home / Self Care | Payer: Managed Care, Other (non HMO) | Attending: Emergency Medicine | Admitting: Emergency Medicine

## 2015-08-15 DIAGNOSIS — R1012 Left upper quadrant pain: Secondary | ICD-10-CM | POA: Diagnosis present

## 2015-08-15 DIAGNOSIS — R109 Unspecified abdominal pain: Secondary | ICD-10-CM

## 2015-08-15 DIAGNOSIS — Z79899 Other long term (current) drug therapy: Secondary | ICD-10-CM | POA: Insufficient documentation

## 2015-08-15 DIAGNOSIS — I10 Essential (primary) hypertension: Secondary | ICD-10-CM | POA: Diagnosis not present

## 2015-08-15 DIAGNOSIS — M25552 Pain in left hip: Secondary | ICD-10-CM | POA: Diagnosis not present

## 2015-08-15 DIAGNOSIS — F17200 Nicotine dependence, unspecified, uncomplicated: Secondary | ICD-10-CM | POA: Insufficient documentation

## 2015-08-15 DIAGNOSIS — R1013 Epigastric pain: Secondary | ICD-10-CM | POA: Diagnosis not present

## 2015-08-15 HISTORY — DX: Accidental discharge from unspecified firearms or gun, initial encounter: W34.00XA

## 2015-08-15 HISTORY — DX: Essential (primary) hypertension: I10

## 2015-08-15 LAB — COMPREHENSIVE METABOLIC PANEL
ALBUMIN: 4.4 g/dL (ref 3.5–5.0)
ALT: 13 U/L — AB (ref 17–63)
AST: 20 U/L (ref 15–41)
Alkaline Phosphatase: 88 U/L (ref 38–126)
Anion gap: 7 (ref 5–15)
BUN: 17 mg/dL (ref 6–20)
CHLORIDE: 106 mmol/L (ref 101–111)
CO2: 25 mmol/L (ref 22–32)
CREATININE: 0.86 mg/dL (ref 0.61–1.24)
Calcium: 9.3 mg/dL (ref 8.9–10.3)
GFR calc Af Amer: >60 mL/min (ref >60)
GFR calc non Af Amer: >60 mL/min (ref >60)
GLUCOSE: 85 mg/dL (ref 65–99)
POTASSIUM: 3.9 mmol/L (ref 3.5–5.1)
SODIUM: 138 mmol/L (ref 135–145)
Total Bilirubin: 0.2 mg/dL — ABNORMAL LOW (ref 0.3–1.2)
Total Protein: 7 g/dL (ref 6.5–8.1)

## 2015-08-15 LAB — CBC
HEMATOCRIT: 38.8 % — AB (ref 40.0–52.0)
Hemoglobin: 13.6 g/dL (ref 13.0–18.0)
MCH: 32.3 pg (ref 26.0–34.0)
MCHC: 34.9 g/dL (ref 32.0–36.0)
MCV: 92.4 fL (ref 80.0–100.0)
Platelets: 153 10*3/uL (ref 150–440)
RBC: 4.2 MIL/uL — ABNORMAL LOW (ref 4.40–5.90)
RDW: 12.6 % (ref 11.5–14.5)
WBC: 6.8 10*3/uL (ref 3.8–10.6)

## 2015-08-15 LAB — URINALYSIS COMPLETE WITH MICROSCOPIC (ARMC ONLY)
BACTERIA UA: NONE SEEN
BILIRUBIN URINE: NEGATIVE
Glucose, UA: NEGATIVE mg/dL
Hgb urine dipstick: NEGATIVE
Ketones, ur: NEGATIVE mg/dL
LEUKOCYTES UA: NEGATIVE
Nitrite: NEGATIVE
PH: 8 (ref 5.0–8.0)
Protein, ur: NEGATIVE mg/dL
RBC / HPF: NONE SEEN RBC/hpf (ref 0–5)
SQUAMOUS EPITHELIAL / LPF: NONE SEEN
Specific Gravity, Urine: 1.008 (ref 1.005–1.030)
WBC, UA: NONE SEEN WBC/hpf (ref 0–5)

## 2015-08-15 LAB — LIPASE, BLOOD: LIPASE: 27 U/L (ref 11–51)

## 2015-08-15 MED ORDER — GI COCKTAIL ~~LOC~~
30.0000 mL | Freq: Once | ORAL | Status: AC
  Administered 2015-08-15: 30 mL via ORAL
  Filled 2015-08-15: qty 30

## 2015-08-15 MED ORDER — ONDANSETRON 4 MG PO TBDP
4.0000 mg | ORAL_TABLET | Freq: Once | ORAL | Status: AC | PRN
  Administered 2015-08-15: 4 mg via ORAL
  Filled 2015-08-15: qty 41

## 2015-08-15 NOTE — ED Notes (Signed)
Pt c/o epigastric pain x 2 hrs and c/o L hip pain secondary to slipping in oil and falling. Pt has hx of acid reflux for which he does not take meds. Pt does have hx of instrumentation in L hip.

## 2015-08-16 MED ORDER — TRAMADOL HCL 50 MG PO TABS
ORAL_TABLET | ORAL | Status: AC
  Administered 2015-08-16: 50 mg via ORAL
  Filled 2015-08-16: qty 1

## 2015-08-16 MED ORDER — SUCRALFATE 1 G PO TABS
1.0000 g | ORAL_TABLET | Freq: Four times a day (QID) | ORAL | Status: AC
Start: 2015-08-16 — End: ?

## 2015-08-16 MED ORDER — TRAMADOL HCL 50 MG PO TABS
50.0000 mg | ORAL_TABLET | Freq: Once | ORAL | Status: AC
  Administered 2015-08-16: 50 mg via ORAL

## 2015-08-16 MED ORDER — RANITIDINE HCL 150 MG PO TABS: 150.0000 mg | ORAL_TABLET | Freq: Two times a day (BID) | ORAL | Status: AC

## 2015-08-16 NOTE — Discharge Instructions (Signed)
Please seek medical attention for any high fevers, chest pain, shortness of breath, change in behavior, persistent vomiting, bloody stool or any other new or concerning symptoms.  Food Choices for Gastroesophageal Reflux Disease, Adult When you have gastroesophageal reflux disease (GERD), the foods you eat and your eating habits are very important. Choosing the right foods can help ease your discomfort.  WHAT GUIDELINES DO I NEED TO FOLLOW?   Choose fruits, vegetables, whole grains, and low-fat dairy products.   Choose low-fat meat, fish, and poultry.  Limit fats such as oils, salad dressings, butter, nuts, and avocado.   Keep a food diary. This helps you identify foods that cause symptoms.   Avoid foods that cause symptoms. These may be different for everyone.   Eat small meals often instead of 3 large meals a day.   Eat your meals slowly, in a place where you are relaxed.   Limit fried foods.   Cook foods using methods other than frying.   Avoid drinking alcohol.   Avoid drinking large amounts of liquids with your meals.   Avoid bending over or lying down until 2-3 hours after eating.  WHAT FOODS ARE NOT RECOMMENDED?  These are some foods and drinks that may make your symptoms worse: Vegetables Tomatoes. Tomato juice. Tomato and spaghetti sauce. Chili peppers. Onion and garlic. Horseradish. Fruits Oranges, grapefruit, and lemon (fruit and juice). Meats High-fat meats, fish, and poultry. This includes hot dogs, ribs, ham, sausage, salami, and bacon. Dairy Whole milk and chocolate milk. Sour cream. Cream. Butter. Ice cream. Cream cheese.  Drinks Coffee and tea. Bubbly (carbonated) drinks or energy drinks. Condiments Hot sauce. Barbecue sauce.  Sweets/Desserts Chocolate and cocoa. Donuts. Peppermint and spearmint. Fats and Oils High-fat foods. This includes [place] fries and potato chips. Other Vinegar. Strong spices. This includes black pepper, white pepper,  red pepper, cayenne, curry powder, cloves, ginger, and chili powder. The items listed above may not be a complete list of foods and drinks to avoid. Contact your dietitian for more information.   This information is not intended to replace advice given to you by your health care provider. Make sure you discuss any questions you have with your health care provider.   Document Released: 08/24/2011 Document Revised: 03/15/2014 Document Reviewed: 12/27/2012 Elsevier Interactive Patient Education [employer]2016 Elsevier Inc.
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
## 2015-12-16 ENCOUNTER — Emergency Department: Payer: Managed Care, Other (non HMO)

## 2015-12-16 ENCOUNTER — Encounter: Payer: Self-pay | Admitting: Emergency Medicine

## 2015-12-16 ENCOUNTER — Emergency Department
Admission: EM | Admit: 2015-12-16 | Discharge: 2015-12-16 | Disposition: A | Discharge status: Home / Self Care | Payer: Managed Care, Other (non HMO) | Attending: Emergency Medicine | Admitting: Emergency Medicine

## 2015-12-16 DIAGNOSIS — R079 Chest pain, unspecified: Secondary | ICD-10-CM

## 2015-12-16 DIAGNOSIS — R0789 Other chest pain: Secondary | ICD-10-CM | POA: Insufficient documentation

## 2015-12-16 DIAGNOSIS — F17200 Nicotine dependence, unspecified, uncomplicated: Secondary | ICD-10-CM | POA: Insufficient documentation

## 2015-12-16 DIAGNOSIS — Z79899 Other long term (current) drug therapy: Secondary | ICD-10-CM | POA: Diagnosis not present

## 2015-12-16 DIAGNOSIS — I10 Essential (primary) hypertension: Secondary | ICD-10-CM | POA: Diagnosis not present

## 2015-12-16 LAB — BASIC METABOLIC PANEL
ANION GAP: 4 — AB (ref 5–15)
BUN: 20 mg/dL (ref 6–20)
CALCIUM: 8.8 mg/dL — AB (ref 8.9–10.3)
CO2: 26 mmol/L (ref 22–32)
Chloride: 107 mmol/L (ref 101–111)
Creatinine, Ser: 0.89 mg/dL (ref 0.61–1.24)
Glucose, Bld: 121 mg/dL — ABNORMAL HIGH (ref 65–99)
Potassium: 4.6 mmol/L (ref 3.5–5.1)
Sodium: 137 mmol/L (ref 135–145)

## 2015-12-16 LAB — CBC
HCT: 38.4 % — ABNORMAL LOW (ref 40.0–52.0)
HEMOGLOBIN: 13.5 g/dL (ref 13.0–18.0)
MCH: 33.1 pg (ref 26.0–34.0)
MCHC: 35.3 g/dL (ref 32.0–36.0)
MCV: 93.8 fL (ref 80.0–100.0)
Platelets: 159 10*3/uL (ref 150–440)
RBC: 4.09 MIL/uL — AB (ref 4.40–5.90)
RDW: 13.1 % (ref 11.5–14.5)
WBC: 5.8 10*3/uL (ref 3.8–10.6)

## 2015-12-16 LAB — TROPONIN I

## 2015-12-16 MED ORDER — ASPIRIN 81 MG PO CHEW
324.0000 mg | CHEWABLE_TABLET | Freq: Once | ORAL | Status: AC
  Administered 2015-12-16: 324 mg via ORAL
  Filled 2015-12-16: qty 4

## 2015-12-16 MED ORDER — NITROGLYCERIN 0.4 MG SL SUBL
0.4000 mg | SUBLINGUAL_TABLET | Freq: Once | SUBLINGUAL | Status: AC
  Administered 2015-12-16: 0.4 mg via SUBLINGUAL
  Filled 2015-12-16: qty 1

## 2015-12-16 NOTE — ED Triage Notes (Signed)
Patient ambulatory to triage with steady gait, without difficulty or distress noted; pt reports upper CP this am with no accomp symptoms; denies hx of same

## 2015-12-16 NOTE — ED Notes (Signed)
Departure note documented on wrong pt . This pt is still present and continues to be treated.
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
## 2015-12-16 NOTE — ED Notes (Signed)
Repeat troponin obtained and sent to lab

## 2015-12-16 NOTE — ED Notes (Signed)
Patient transported to X-ray 

## 2015-12-16 NOTE — ED Notes (Signed)
edp in to see pt at this time.  

## 2015-12-19 ENCOUNTER — Emergency Department
Admission: EM | Admit: 2015-12-19 | Discharge: 2015-12-19 | Disposition: A | Discharge status: Home / Self Care | Payer: Managed Care, Other (non HMO) | Attending: Emergency Medicine | Admitting: Emergency Medicine

## 2015-12-19 ENCOUNTER — Emergency Department: Payer: Managed Care, Other (non HMO)

## 2015-12-19 DIAGNOSIS — Z79899 Other long term (current) drug therapy: Secondary | ICD-10-CM | POA: Diagnosis not present

## 2015-12-19 DIAGNOSIS — R079 Chest pain, unspecified: Secondary | ICD-10-CM | POA: Diagnosis present

## 2015-12-19 DIAGNOSIS — I10 Essential (primary) hypertension: Secondary | ICD-10-CM | POA: Insufficient documentation

## 2015-12-19 DIAGNOSIS — F17200 Nicotine dependence, unspecified, uncomplicated: Secondary | ICD-10-CM | POA: Insufficient documentation

## 2015-12-19 LAB — CBC
HCT: 36.2 % — ABNORMAL LOW (ref 40.0–52.0)
Hemoglobin: 12.8 g/dL — ABNORMAL LOW (ref 13.0–18.0)
MCH: 33.1 pg (ref 26.0–34.0)
MCHC: 35.3 g/dL (ref 32.0–36.0)
MCV: 93.7 fL (ref 80.0–100.0)
PLATELETS: 144 10*3/uL — AB (ref 150–440)
RBC: 3.87 MIL/uL — ABNORMAL LOW (ref 4.40–5.90)
RDW: 13.4 % (ref 11.5–14.5)
WBC: 6 10*3/uL (ref 3.8–10.6)

## 2015-12-19 LAB — BASIC METABOLIC PANEL
Anion gap: 8 (ref 5–15)
BUN: 16 mg/dL (ref 6–20)
CALCIUM: 8.9 mg/dL (ref 8.9–10.3)
CO2: 26 mmol/L (ref 22–32)
CREATININE: 0.9 mg/dL (ref 0.61–1.24)
Chloride: 103 mmol/L (ref 101–111)
GFR calc Af Amer: >60 mL/min (ref >60)
GLUCOSE: 151 mg/dL — AB (ref 65–99)
Potassium: 3.3 mmol/L — ABNORMAL LOW (ref 3.5–5.1)
Sodium: 137 mmol/L (ref 135–145)

## 2015-12-19 LAB — TROPONIN I: Troponin I: <0.03 ng/mL (ref <0.03)

## 2015-12-19 MED ORDER — MORPHINE SULFATE (PF) 4 MG/ML IV SOLN
INTRAVENOUS | Status: AC
  Administered 2015-12-19: 4 mg via INTRAVENOUS
  Filled 2015-12-19: qty 1

## 2015-12-19 MED ORDER — ONDANSETRON HCL 4 MG/2ML IJ SOLN
INTRAMUSCULAR | Status: AC
Start: 2015-12-19 — End: 2015-12-19
  Administered 2015-12-19: 4 mg via INTRAVENOUS
  Filled 2015-12-19: qty 2

## 2015-12-19 MED ORDER — MORPHINE SULFATE (PF) 4 MG/ML IV SOLN
4.0000 mg | Freq: Once | INTRAVENOUS | Status: AC
  Administered 2015-12-19: 4 mg via INTRAVENOUS

## 2015-12-19 MED ORDER — SODIUM CHLORIDE 0.9 % IV BOLUS (SEPSIS)
1000.0000 mL | Freq: Once | INTRAVENOUS | Status: AC
  Administered 2015-12-19: 1000 mL via INTRAVENOUS

## 2015-12-19 MED ORDER — ONDANSETRON HCL 4 MG/2ML IJ SOLN
4.0000 mg | Freq: Once | INTRAMUSCULAR | Status: AC
  Administered 2015-12-19: 4 mg via INTRAVENOUS

## 2015-12-19 NOTE — ED Triage Notes (Signed)
[facility] that began 30 minutes PTA. 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 Pt was seen here Tuesday for same, told it was stress induced. Pt denies SOB, N&V, states he feels dizzy. EMS gave ASA and 1 nitro that dropped his BP from 110 to 92 so EMS started NS bolus. Pt states L sided CP that is sharp. Pt states more stress at home recently.

## 2015-12-19 NOTE — ED Notes (Signed)
Pt sleeping on stretcher, tv on, lights dimmed.

## 2015-12-19 NOTE — ED Notes (Signed)
Pt states he was eating when pain started, took 1 bite of a sandwich. Denies any greasy/spicy foods were eaten. Pt moaning, stated ASA and nitro did not help pain given by EMS.

## 2015-12-19 NOTE — ED Notes (Signed)
Pt wife called. Pt stated this RN could update wife.   Wife Selena BattenKim [phone] (cell)

## 2015-12-19 NOTE — Discharge Instructions (Signed)
You were evaluated for chest pain, and although no certain cause was found, and your exam and evaluation are reassuring in the emergency department today.  Return to the emergency room for any new or worsening chest pain, nausea, sweating, passing out, trouble breathing, or any other symptoms concerning to you.  You do need to follow-up with a cardiologist, please see on Monday -- the office is supposed to call on Monday morning to make an appointment time.

## 2015-12-19 NOTE — ED Notes (Signed)
Pt given a sandwich tray to eat along with a ginger ale, ok per dr Shaune PollackLord.
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
## 2015-12-22 ENCOUNTER — Ambulatory Visit (INDEPENDENT_AMBULATORY_CARE_PROVIDER_SITE_OTHER): Payer: Managed Care, Other (non HMO) | Admitting: Cardiovascular Disease

## 2015-12-22 ENCOUNTER — Encounter: Payer: Self-pay | Admitting: Cardiovascular Disease

## 2015-12-22 VITALS — BP 88/50 | HR 60 | Ht 65.0 in | Wt 136.5 lb

## 2015-12-22 DIAGNOSIS — F1011 Alcohol abuse, in remission: Secondary | ICD-10-CM | POA: Insufficient documentation

## 2015-12-22 DIAGNOSIS — S81832S Puncture wound without foreign body, left lower leg, sequela: Secondary | ICD-10-CM

## 2015-12-22 DIAGNOSIS — R079 Chest pain, unspecified: Secondary | ICD-10-CM | POA: Insufficient documentation

## 2015-12-22 DIAGNOSIS — M79605 Pain in left leg: Secondary | ICD-10-CM

## 2015-12-22 DIAGNOSIS — I10 Essential (primary) hypertension: Secondary | ICD-10-CM

## 2015-12-22 DIAGNOSIS — Z87898 Personal history of other specified conditions: Secondary | ICD-10-CM

## 2015-12-22 DIAGNOSIS — Z8719 Personal history of other diseases of the digestive system: Secondary | ICD-10-CM

## 2015-12-22 DIAGNOSIS — W3400XA Accidental discharge from unspecified firearms or gun, initial encounter: Secondary | ICD-10-CM | POA: Insufficient documentation

## 2015-12-22 DIAGNOSIS — G8929 Other chronic pain: Secondary | ICD-10-CM

## 2015-12-22 DIAGNOSIS — S81802S Unspecified open wound, left lower leg, sequela: Secondary | ICD-10-CM | POA: Diagnosis not present

## 2015-12-22 DIAGNOSIS — F17200 Nicotine dependence, unspecified, uncomplicated: Secondary | ICD-10-CM

## 2015-12-22 DIAGNOSIS — S81832A Puncture wound without foreign body, left lower leg, initial encounter: Secondary | ICD-10-CM | POA: Insufficient documentation

## 2015-12-22 DIAGNOSIS — W3400XS Accidental discharge from unspecified firearms or gun, sequela: Secondary | ICD-10-CM
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
## 2015-12-22 NOTE — Patient Instructions (Addendum)
Medication Instructions:   No medication changes made  Labwork:  No new labs needed  Testing/Procedures:  We will schedule a pharmacological myoview for chest pain Unable to treadmill   Follow-Up: It was a pleasure seeing you in the office today. Please call us if you have new issues that need to be addressed before your next appt.  [phone]  Your physician wants you to follow-up in: as needed  If you need a refill on your cardiac medications before your next appointment, please call your pharmacy.       Steps to Quit Smoking  Smoking tobacco can be harmful to your health and can affect almost every organ in your body. Smoking puts you, and those around you, at risk for developing many serious chronic diseases. Quitting smoking is difficult, but it is one of the best things that you can do for your health. It is never too late to quit. WHAT ARE THE BENEFITS OF QUITTING SMOKING? When you quit smoking, you lower your risk of developing serious diseases and conditions, such as:  Lung cancer or lung disease, such as COPD.  Heart disease.  Stroke.  Heart attack.  Infertility.  Osteoporosis and bone fractures. Additionally, symptoms such as coughing, wheezing, and shortness of breath may get better when you quit. You may also find that you get sick less often because your body is stronger at fighting off colds and infections. If you are pregnant, quitting smoking can help to reduce your chances of having a baby of low birth weight. HOW DO I GET READY TO QUIT? When you decide to quit smoking, create a plan to make sure that you are successful. Before you quit:  Pick a date to quit. Set a date within the next two weeks to give you time to prepare.  Write down the reasons why you are quitting. Keep this list in places where you will see it often, such as on your bathroom mirror or in your car or wallet.  Identify the people, places, things, and activities that make you  want to smoke (triggers) and avoid them. Make sure to take these actions:  Throw away all cigarettes at home, at work, and in your car.  Throw away smoking accessories, such as Set designer.  Clean your car and make sure to empty the ashtray.  Clean your home, including curtains and carpets.  Tell your family, friends, and coworkers that you are quitting. Support from your loved ones can make quitting easier.  Talk with your health care provider about your options for quitting smoking.  Find out what treatment options are covered by your health insurance. WHAT STRATEGIES CAN I USE TO QUIT SMOKING?  Talk with your healthcare provider about different strategies to quit smoking. Some strategies include:  Quitting smoking altogether instead of gradually lessening how much you smoke over a period of time. Research shows that quitting "cold [place]" is more successful than gradually quitting.  Attending in-person counseling to help you build problem-solving skills. You are more likely to have success in quitting if you attend several counseling sessions. Even short sessions of 10 minutes can be effective.  Finding resources and support systems that can help you to quit smoking and remain smoke-free after you quit. These resources are most helpful when you use them often. They can include:  Online chats with a Veterinary surgeon.  Telephone quitlines.  Printed Materials engineer.  Support groups or group counseling.  Text messaging programs.  Mobile phone applications.  Taking  medicines to help you quit smoking. (If you are pregnant or breastfeeding, talk with your health care provider first.) Some medicines contain nicotine and some do not. Both types of medicines help with cravings, but the medicines that include nicotine help to relieve withdrawal symptoms. Your health care provider may recommend:  Nicotine patches, gum, or lozenges.  Nicotine inhalers or sprays.  Non-nicotine  medicine that is taken by mouth. Talk with your health care provider about combining strategies, such as taking medicines while you are also receiving in-person counseling. Using these two strategies together makes you more likely to succeed in quitting than if you used either strategy on its own. If you are pregnant or breastfeeding, talk with your health care provider about finding counseling or other support strategies to quit smoking. Do not take medicine to help you quit smoking unless told to do so by your health care provider. WHAT THINGS CAN I DO TO MAKE IT EASIER TO QUIT? Quitting smoking might feel overwhelming at first, but there is a lot that you can do to make it easier. Take these important actions:  Reach out to your family and friends and ask that they support and encourage you during this time. Call telephone quitlines, reach out to support groups, or work with a counselor for support.  Ask people who smoke to avoid smoking around you.  Avoid places that trigger you to smoke, such as bars, parties, or smoke-break areas at work.  Spend time around people who do not smoke.  Lessen stress in your life, because stress can be a smoking trigger for some people. To lessen stress, try:  Exercising regularly.  Deep-breathing exercises.  Yoga.  Meditating.  Performing a body scan. This involves closing your eyes, scanning your body from head to toe, and noticing which parts of your body are particularly tense. Purposefully relax the muscles in those areas.  Download or purchase mobile phone or tablet apps (applications) that can help you stick to your quit plan by providing reminders, tips, and encouragement. There are many free apps, such as QuitGuide from the [employer] developer(Centers for Disease Control and Prevention). You can find other support for quitting smoking (smoking cessation) through smokefree.gov and other websites. HOW WILL I FEEL WHEN I QUIT SMOKING? Within the first 24 hours  of quitting smoking, you may start to feel some withdrawal symptoms. These symptoms are usually most noticeable 2-3 days after quitting, but they usually do not last beyond 2-3 weeks. Changes or symptoms that you might experience include:  Mood swings.  Restlessness, anxiety, or irritation.  Difficulty concentrating.  Dizziness.  Strong cravings for sugary foods in addition to nicotine.  Mild weight gain.  Constipation.  Nausea.  Coughing or a sore throat.  Changes in how your medicines work in your body.  A depressed mood.  Difficulty sleeping (insomnia). After the first 2-3 weeks of quitting, you may start to notice more positive results, such as:  Improved sense of smell and taste.  Decreased coughing and sore throat.  Slower heart rate.  Lower blood pressure.  Clearer skin.  The ability to breathe more easily.  Fewer sick days. Quitting smoking is very challenging for most people. Do not get discouraged if you are not successful the first time. Some people need to make many attempts to quit before they achieve long-term success. Do your best to stick to your quit plan, and talk with your health care provider if you have any questions or concerns.   This information  is not intended to replace advice given to you by your health care provider. Make sure you discuss any questions you have with your health care provider.   Document Released: 02/16/2001 Document Revised: 07/09/2014 Document Reviewed: 07/09/2014 Elsevier Interactive Patient Education 2016 [employer]. Smoking Hazards Smoking cigarettes is extremely bad for your health. Tobacco smoke has over 200 known poisons in it. It contains the poisonous gases nitrogen oxide and carbon monoxide. There are over 60 chemicals in tobacco smoke that cause cancer. Some of the chemicals found in cigarette smoke include:   Cyanide.   Benzene.   Formaldehyde.   Methanol (wood alcohol).   Acetylene (fuel used in  welding torches).   Ammonia.  Even smoking lightly shortens your life expectancy by several years. You can greatly reduce the risk of medical problems for you and your family by stopping now. Smoking is the most preventable cause of death and disease in our society. Within days of quitting smoking, your circulation improves, you decrease the risk of having a heart attack, and your lung capacity improves. There may be some increased phlegm in the first few days after quitting, and it may take months for your lungs to clear up completely. Quitting for 10 years reduces your risk of developing lung cancer to almost that of a nonsmoker.  WHAT ARE THE RISKS OF SMOKING? Cigarette smokers have an increased risk of many serious medical problems, including:  Lung cancer.   Lung disease (such as pneumonia, bronchitis, and emphysema).   Heart attack and chest pain due to the heart not getting enough oxygen (angina).   Heart disease and peripheral blood vessel disease.   Hypertension.   Stroke.   Oral cancer (cancer of the lip, mouth, or voice box).   Bladder cancer.   Pancreatic cancer.   Cervical cancer.   Pregnancy complications, including premature birth.   Stillbirths and smaller newborn babies, birth defects, and genetic damage to sperm.   Early menopause.   Lower estrogen level for women.   Infertility.   Facial wrinkles.   Blindness.   Increased risk of broken bones (fractures).   Senile dementia.   Stomach ulcers and internal bleeding.   Delayed wound healing and increased risk of complications during surgery. Because of secondhand smoke exposure, children of smokers have an increased risk of the following:   Sudden infant death syndrome (SIDS).   Respiratory infections.   Lung cancer.   Heart disease.   Ear infections.  WHY IS SMOKING ADDICTIVE? Nicotine is the chemical agent in tobacco that is capable of causing addiction or  dependence. When you smoke and inhale, nicotine is absorbed rapidly into the bloodstream through your lungs. Both inhaled and noninhaled nicotine may be addictive.  WHAT ARE THE BENEFITS OF QUITTING?  There are many health benefits to quitting smoking. Some are:   The likelihood of developing cancer and heart disease decreases. Health improvements are seen almost immediately.   Blood pressure, pulse rate, and breathing patterns start returning to normal soon after quitting.   People who quit may see an improvement in their overall quality of life.  HOW DO YOU QUIT SMOKING? Smoking is an addiction with both physical and psychological effects, and longtime habits can be hard to change. Your health care provider can recommend:  Programs and community resources, which may include group support, education, or therapy.  Replacement products, such as patches, gum, and nasal sprays. Use these products only as directed. Do not replace cigarette smoking with electronic cigarettes (  commonly called e-cigarettes). The safety of e-cigarettes is unknown, and some may contain harmful chemicals. FOR MORE INFORMATION  American Lung Association: www.lung.org  American Cancer Society: www.cancer.org   This information is not intended to replace advice given to you by your health care provider. Make sure you discuss any questions you have with your health care provider.   Document Released: 04/01/2004 Document Revised: 12/13/2012 Document Reviewed: 08/14/2012 Elsevier Interactive Patient Education 2016 [employer]. Secondhand Smoke WHAT IS SECONDHAND SMOKE? Secondhand smoke is smoke that comes from burning tobacco. It could be the smoke from a cigarette, a pipe, or a cigar. Even if you are not the one smoking, secondhand smoke exposes you to the dangers of smoking. This is called involuntary, or passive, smoking. There are two types of secondhand smoke:  Sidestream smoke is the smoke that comes off the  lighted end of a cigarette, pipe, or cigar.  This type of smoke has the highest amount of cancer-causing agents (carcinogens).  The particles in sidestream smoke are smaller. They get into your lungs more easily.  Mainstream smoke is the smoke that is exhaled by a person who is smoking.  This type of smoke is also dangerous to your health. HOW CAN SECONDHAND SMOKE AFFECT MY HEALTH? Studies show that there is no safe level of secondhand smoke. This smoke contains thousands of chemicals. At least 69 of them are known to cause cancer. Secondhand smoke can also cause many other health problems. It has been linked to:  Lung cancer.  Cancer of the voice box (larynx) or throat.  Cancer of the sinuses.  Brain cancer.  Bladder cancer.  Stomach cancer.  Breast cancer.  White blood cell cancers (lymphoma and leukemia).  Brain and liver tumors in children.  Heart disease and stroke in adults.  Pregnancy loss (miscarriage).  Diseases in children, such as:  Asthma.  Lung infections.  Ear infections.  Sudden infant death syndrome (SIDS).  Slow growth. WHERE CAN I BE AT RISK FOR EXPOSURE TO SECONDHAND SMOKE?   For adults, the workplace is the main source of exposure to secondhand smoke.  Your workplace should have a policy separating smoking areas from nonsmoking areas.  Smoking areas should have a system for ventilating and cleaning the air.  For children, the home may be the most dangerous place for exposure to secondhand smoke.  Children who live in apartment buildings may be at risk from smoke drifting from hallways or other people's homes.  For everyone, many public places are possible sources of exposure to secondhand smoke.  These places include restaurants, shopping centers, and parks. HOW CAN I REDUCE MY RISK FOR EXPOSURE TO SECONDHAND SMOKE? The most important thing you can do is not smoke. Discourage family members from smoking. Other ways to reduce exposure  for you and your family include the following:  Keep your home smoke free.  Make sure your child care providers do not smoke.  Warn your child about the dangers of smoking and secondhand smoke.  Do not allow smoking in your car. When someone smokes in a car, all the damaging chemicals from the smoke are confined in a small area.  Avoid public places where smoking is allowed.   This information is not intended to replace advice given to you by your health care provider. Make sure you discuss any questions you have with your health care provider.   Document Released: 04/01/2004 Document Revised: 03/15/2014 Document Reviewed: 06/08/2013 Elsevier Interactive Patient Education [employer]

## 2015-12-26 ENCOUNTER — Telehealth: Payer: Self-pay | Admitting: Cardiovascular Disease

## 2015-12-26 DIAGNOSIS — R079 Chest pain, unspecified: Secondary | ICD-10-CM

## 2015-12-26 NOTE — Telephone Encounter (Signed)
Lexiscan myoview sched for Friday, 10/27 @ 9:00.  Left message for pt to call back.

## 2015-12-26 NOTE — Telephone Encounter (Signed)
Please call patient, as he is ready to schedule his stress test. He requests Next Friday 10/27.

## 2015-12-29 NOTE — Telephone Encounter (Signed)
Spoke w/ pt.  Reviewed instructions for Lexi. Pt verbalizes understanding and [facility] @ 8:45. Asked him to call back w/ any other questions or concerns.

## 2015-12-29 NOTE — Telephone Encounter (Signed)
Left message to pt to call back to go over instructions for lexi.

## 2016-01-02 ENCOUNTER — Telehealth: Payer: Self-pay | Admitting: Cardiovascular Disease

## 2016-01-02 ENCOUNTER — Encounter
Admission: RE | Admit: 2016-01-02 | Discharge: 2016-01-02 | Disposition: A | Discharge status: Home / Self Care | Payer: Managed Care, Other (non HMO) | Source: Ambulatory Visit | Attending: Cardiovascular Disease | Admitting: Cardiovascular Disease

## 2016-01-02 DIAGNOSIS — R079 Chest pain, unspecified: Secondary | ICD-10-CM | POA: Insufficient documentation

## 2016-01-02 NOTE — Telephone Encounter (Signed)
Pt was unable to have his stress test this morning, due to him smoking. He would like to reschedule. Please call.

## 2016-01-02 NOTE — Telephone Encounter (Signed)
Spoke w/ pt's wife.  Lexiscan resched to 01/08/16 @ 7:30. Reviewed pre-procedure instructions and mailed to pt as well. She is appreciative and will call back w/ any questions or concerns.

## 2016-01-08 ENCOUNTER — Encounter
Admission: RE | Admit: 2016-01-08 | Discharge: 2016-01-08 | Disposition: A | Discharge status: Home / Self Care | Payer: Managed Care, Other (non HMO) | Source: Ambulatory Visit | Attending: Cardiovascular Disease | Admitting: Cardiovascular Disease

## 2016-01-08 DIAGNOSIS — R079 Chest pain, unspecified: Secondary | ICD-10-CM | POA: Insufficient documentation

## 2016-01-08 LAB — NM MYOCAR MULTI W/SPECT W/WALL MOTION / EF
CHL CUP NUCLEAR SRS: 2
CHL CUP NUCLEAR SSS: 1
CHL CUP STRESS STAGE 1 HR: 58 {beats}/min
CHL CUP STRESS STAGE 3 SPEED: 0 mph
CHL CUP STRESS STAGE 4 HR: 100 {beats}/min
CHL CUP STRESS STAGE 4 SPEED: 0 mph
CHL CUP STRESS STAGE 5 DBP: 77 mmHg
CHL CUP STRESS STAGE 5 HR: 102 {beats}/min
CHL CUP STRESS STAGE 5 SBP: 110 mmHg
CHL CUP STRESS STAGE 5 SPEED: 0 mph
CHL CUP STRESS STAGE 6 SBP: 110 mmHg
CHL CUP STRESS STAGE 6 SPEED: 0 mph
CSEPPMHR: 58 %
Estimated workload: 1 METS
LV dias vol: 87 mL (ref 62–150)
LV sys vol: 42 mL
Peak HR: 100 {beats}/min
Percent HR: 59 %
Rest HR: 59 {beats}/min
SDS: 0
Stage 2 Grade: 0 %
Stage 2 HR: 59 {beats}/min
Stage 2 Speed: 0 mph
Stage 3 Grade: 0 %
Stage 3 HR: 59 {beats}/min
Stage 4 Grade: 0 %
Stage 5 Grade: 0 %
Stage 6 DBP: 77 mmHg
Stage 6 Grade: 0 %
Stage 6 HR: 80 {beats}/min
TID: 0.99

## 2016-01-08 MED ORDER — REGADENOSON 0.4 MG/5ML IV SOLN
0.4000 mg | Freq: Once | INTRAVENOUS | Status: AC
  Administered 2016-01-08: 0.4 mg via INTRAVENOUS

## 2016-01-08 MED ORDER — TECHNETIUM TC 99M TETROFOSMIN IV KIT
30.5800 | PACK | Freq: Once | INTRAVENOUS | Status: AC | PRN
Start: 2016-01-08 — End: 2016-01-08
  Administered 2016-01-08: 30.58 via INTRAVENOUS

## 2016-01-08 MED ORDER — TECHNETIUM TC 99M TETROFOSMIN IV KIT
12.7500 | PACK | Freq: Once | INTRAVENOUS | Status: AC | PRN
  Administered 2016-01-08: 12.75 via INTRAVENOUS

## 2016-01-12 ENCOUNTER — Other Ambulatory Visit: Payer: Self-pay

## 2016-01-12 ENCOUNTER — Other Ambulatory Visit
Admission: RE | Admit: 2016-01-12 | Discharge: 2016-01-12 | Disposition: A | Discharge status: Home / Self Care | Payer: 59 | Source: Ambulatory Visit | Attending: Cardiovascular Disease | Admitting: Cardiovascular Disease

## 2016-01-12 DIAGNOSIS — R079 Chest pain, unspecified: Secondary | ICD-10-CM

## 2016-01-12 DIAGNOSIS — R9439 Abnormal result of other cardiovascular function study: Secondary | ICD-10-CM

## 2016-01-12 DIAGNOSIS — Z01812 Encounter for preprocedural laboratory examination: Secondary | ICD-10-CM

## 2016-01-12 LAB — CBC WITH DIFFERENTIAL/PLATELET
BASOS ABS: 0.1 10*3/uL (ref 0–0.1)
BASOS PCT: 1 %
EOS ABS: 0.3 10*3/uL (ref 0–0.7)
Eosinophils Relative: 5 %
HCT: 37.8 % — ABNORMAL LOW (ref 40.0–52.0)
HEMOGLOBIN: 13.3 g/dL (ref 13.0–18.0)
Lymphocytes Relative: 34 %
Lymphs Abs: 2 10*3/uL (ref 1.0–3.6)
MCH: 32.7 pg (ref 26.0–34.0)
MCHC: 35.1 g/dL (ref 32.0–36.0)
MCV: 93.1 fL (ref 80.0–100.0)
MONO ABS: 0.5 10*3/uL (ref 0.2–1.0)
MONOS PCT: 8 %
NEUTROS PCT: 52 %
Neutro Abs: 3.2 10*3/uL (ref 1.4–6.5)
Platelets: 155 10*3/uL (ref 150–440)
RBC: 4.06 MIL/uL — ABNORMAL LOW (ref 4.40–5.90)
RDW: 12.9 % (ref 11.5–14.5)
WBC: 6 10*3/uL (ref 3.8–10.6)

## 2016-01-12 LAB — BASIC METABOLIC PANEL
Anion gap: 9 (ref 5–15)
BUN: 18 mg/dL (ref 6–20)
CALCIUM: 9.3 mg/dL (ref 8.9–10.3)
CO2: 24 mmol/L (ref 22–32)
CREATININE: 0.71 mg/dL (ref 0.61–1.24)
Chloride: 102 mmol/L (ref 101–111)
Glucose, Bld: 81 mg/dL (ref 65–99)
Potassium: 4 mmol/L (ref 3.5–5.1)
Sodium: 135 mmol/L (ref 135–145)

## 2016-01-12 LAB — PROTIME-INR
INR: 1.03
PROTHROMBIN TIME: 13.5 s (ref 11.4–15.2)

## 2016-01-13 ENCOUNTER — Other Ambulatory Visit: Payer: Self-pay | Admitting: Cardiovascular Disease

## 2016-01-13 DIAGNOSIS — I200 Unstable angina: Secondary | ICD-10-CM

## 2016-01-14 ENCOUNTER — Encounter
Admission: RE | Disposition: A | Discharge status: Home / Self Care | Payer: Self-pay | Source: Ambulatory Visit | Attending: Cardiovascular Disease

## 2016-01-14 ENCOUNTER — Ambulatory Visit
Admission: RE | Admit: 2016-01-14 | Discharge: 2016-01-14 | Disposition: A | Discharge status: Home / Self Care | Payer: 59 | Source: Ambulatory Visit | Attending: Cardiovascular Disease | Admitting: Cardiovascular Disease

## 2016-01-14 ENCOUNTER — Encounter: Payer: Self-pay | Admitting: *Deleted

## 2016-01-14 DIAGNOSIS — Z79899 Other long term (current) drug therapy: Secondary | ICD-10-CM | POA: Insufficient documentation

## 2016-01-14 DIAGNOSIS — I200 Unstable angina: Secondary | ICD-10-CM

## 2016-01-14 DIAGNOSIS — I10 Essential (primary) hypertension: Secondary | ICD-10-CM | POA: Diagnosis not present

## 2016-01-14 DIAGNOSIS — F17210 Nicotine dependence, cigarettes, uncomplicated: Secondary | ICD-10-CM | POA: Insufficient documentation

## 2016-01-14 DIAGNOSIS — R079 Chest pain, unspecified: Secondary | ICD-10-CM | POA: Diagnosis not present

## 2016-01-14 HISTORY — PX: CARDIAC CATHETERIZATION: SHX172

## 2016-01-14 SURGERY — LEFT HEART CATH AND CORONARY ANGIOGRAPHY
Anesthesia: Moderate Sedation | Laterality: Bilateral

## 2016-01-14 SURGERY — LEFT HEART CATH AND CORONARY ANGIOGRAPHY
Anesthesia: Moderate Sedation | Laterality: Left

## 2016-01-14 MED ORDER — IOPAMIDOL (ISOVUE-300) INJECTION 61%
INTRAVENOUS | Status: DC | PRN
  Administered 2016-01-14: 90 mL via INTRA_ARTERIAL

## 2016-01-14 MED ORDER — SODIUM CHLORIDE 0.9 % WEIGHT BASED INFUSION: 1.0000 mL/kg/h | INTRAVENOUS | Status: DC

## 2016-01-14 MED ORDER — MIDAZOLAM HCL 2 MG/2ML IJ SOLN
INTRAMUSCULAR | Status: AC
  Filled 2016-01-14: qty 2

## 2016-01-14 MED ORDER — SODIUM CHLORIDE 0.9 % IV SOLN: 250.0000 mL | INTRAVENOUS | Status: DC | PRN

## 2016-01-14 MED ORDER — SODIUM CHLORIDE 0.9 % WEIGHT BASED INFUSION
3.0000 mL/kg/h | INTRAVENOUS | Status: DC
  Administered 2016-01-14: 3 mL/kg/h via INTRAVENOUS

## 2016-01-14 MED ORDER — FENTANYL CITRATE (PF) 100 MCG/2ML IJ SOLN
INTRAMUSCULAR | Status: DC | PRN
  Administered 2016-01-14 (×3): 25 ug via INTRAVENOUS

## 2016-01-14 MED ORDER — MIDAZOLAM HCL 2 MG/2ML IJ SOLN
INTRAMUSCULAR | Status: DC | PRN
  Administered 2016-01-14 (×3): 1 mg via INTRAVENOUS

## 2016-01-14 MED ORDER — SODIUM CHLORIDE 0.9% FLUSH: 3.0000 mL | Freq: Two times a day (BID) | INTRAVENOUS | Status: DC

## 2016-01-14 MED ORDER — HEPARIN (PORCINE) IN NACL 2-0.9 UNIT/ML-% IJ SOLN
INTRAMUSCULAR | Status: AC
  Filled 2016-01-14: qty 500

## 2016-01-14 MED ORDER — FENTANYL CITRATE (PF) 100 MCG/2ML IJ SOLN
INTRAMUSCULAR | Status: AC
  Filled 2016-01-14: qty 2

## 2016-01-14 MED ORDER — SODIUM CHLORIDE 0.9% FLUSH: 3.0000 mL | INTRAVENOUS | Status: DC | PRN

## 2016-01-14 MED ORDER — SODIUM CHLORIDE 0.9 % WEIGHT BASED INFUSION: 3.0000 mL/kg/h | INTRAVENOUS | Status: DC

## 2016-01-14 MED ORDER — ASPIRIN 81 MG PO CHEW
81.0000 mg | CHEWABLE_TABLET | ORAL | Status: AC
  Administered 2016-01-14: 81 mg via ORAL

## 2016-01-14 MED ORDER — ACETAMINOPHEN 325 MG PO TABS: 650.0000 mg | ORAL_TABLET | ORAL | Status: DC | PRN

## 2016-01-14 MED ORDER — ASPIRIN 81 MG PO CHEW
CHEWABLE_TABLET | ORAL | Status: AC
  Filled 2016-01-14: qty 1

## 2016-01-14 MED ORDER — ONDANSETRON HCL 4 MG/2ML IJ SOLN: 4.0000 mg | Freq: Four times a day (QID) | INTRAMUSCULAR | Status: DC | PRN

## 2016-01-14 SURGICAL SUPPLY — 8 items
CATH 5FR JR4 DIAGNOSTIC (CATHETERS) ×3 IMPLANT
CATH INFINITI 5FR ANG PIGTAIL (CATHETERS) ×3 IMPLANT
CATH INFINITI 5FR JL4 (CATHETERS) ×3 IMPLANT
GUIDEWIRE 3MM J TIP .035 145 (WIRE) ×3 IMPLANT
KIT MANI 3VAL PERCEP (MISCELLANEOUS) ×3 IMPLANT
NEEDLE PERC 18GX7CM (NEEDLE) ×3 IMPLANT
PACK CARDIAC CATH (CUSTOM PROCEDURE TRAY) ×3 IMPLANT
SHEATH AVANTI 5FR X 11CM (SHEATH) ×3 IMPLANT

## 2016-01-14 NOTE — Discharge Instructions (Signed)
Angiogram  An angiogram is an X-ray test. It is used to look at your blood vessels. For this test, a dye is put into the blood vessel being checked. The dye shows up on X-rays. It helps your doctor see if there is a blockage or other problem in the blood vessel.  BEFORE THE PROCEDURE  · Follow your doctor's instructions about limiting what you eat or drink.  · Ask your doctor if you may drink enough water to take any needed medicines the morning of the test.  · Plan to have someone take you home after the test.  · If you go home the same day as the test, plan to have someone stay with you for 24 hours.  PROCEDURE   · An IV tube will be put into one of your veins.  · You will be given a medicine that makes you relax (sedative).  · Your skin will be washed and shaved where the thin tube (catheter) will be inserted. This will usually be done in the upper part of your leg (groin). It may also be done in your arm near the elbow or in your wrist.  · You will be given a medicine that numbs the area where the tube will be inserted (local anesthetic).  · The tube will be inserted into a blood vessel.  · Using a type of X-ray (fluoroscopy) to see, your doctor will move the tube into the blood vessel to check it.  · Dye will be put in through the tube. X-rays of your blood vessels will then be taken.  Different health care providers and hospitals may do this procedure differently.  AFTER THE PROCEDURE   · If the test is done through the leg, you will be kept in bed lying flat for several hours. You will be told to not bend or cross your legs.    · The area where the tube was inserted will be checked often.    · The pulse in your feet or wrist will be checked often.    · More tests or X-rays may be done.       This information is not intended to replace advice given to you by your health care provider. Make sure you discuss any questions you have with your health care provider.     Document Released: 05/21/2008 Document  Revised: 03/15/2014 Document Reviewed: 07/26/2012  Elsevier Interactive Patient Education ©2016 Elsevier Inc.

## 2016-01-14 NOTE — Progress Notes (Signed)
Pt clinically stable post heart cath with clean coronaries, no bleeding nor hematoma at right groin, wife present. Dr Mariah MillingGollan out to speak with patient and wife, vss, taking po's without difficulty, sr/sb per monitor, denies complaints at this time, on bedrest for 4hours with manual hold.

## 2016-01-14 NOTE — Progress Notes (Signed)
Pt has remained clinically stable post heart cath, no bleeding nor hematoma, hob elevated to eat lunch at this time. Discharge instructions given with questions answered. Wife present

## 2016-01-15 ENCOUNTER — Encounter: Payer: Self-pay | Admitting: Cardiovascular Disease

## 2016-01-16 ENCOUNTER — Telehealth: Payer: Self-pay | Admitting: Cardiovascular Disease

## 2016-01-16 NOTE — Telephone Encounter (Signed)
Spoke w/ pt.  He reports that he is still having swelling at cath insertion site. Pt denies redness, heat or pain. Advised him that swelling is normal and that he may have bruising that may or may not go down his leg due to gravity.  Advised him to continue to monitor and call back w/ any further questions or concerns.  He is appreciative of the call.

## 2016-01-16 NOTE — Telephone Encounter (Signed)
Pt states he had a "angiogram"  This past week Is still having some swelling and just wanted to know how long would it be like this If this is normal  Please advise

## 2016-01-22 ENCOUNTER — Ambulatory Visit: Payer: Managed Care, Other (non HMO) | Admitting: Cardiovascular Disease

## 2016-02-02 ENCOUNTER — Ambulatory Visit: Payer: Managed Care, Other (non HMO) | Admitting: Cardiovascular Disease

## 2016-02-03 ENCOUNTER — Ambulatory Visit: Payer: Managed Care, Other (non HMO) | Admitting: Cardiovascular Disease

## 2016-02-05 ENCOUNTER — Encounter: Payer: Self-pay | Admitting: *Deleted

## 2017-02-12 ENCOUNTER — Emergency Department (HOSPITAL_COMMUNITY): Payer: 59 | Admitting: Anesthesiology

## 2017-02-12 ENCOUNTER — Other Ambulatory Visit: Payer: Self-pay

## 2017-02-12 ENCOUNTER — Encounter (HOSPITAL_COMMUNITY): Payer: Self-pay | Admitting: *Deleted

## 2017-02-12 ENCOUNTER — Emergency Department (HOSPITAL_COMMUNITY): Payer: 59

## 2017-02-12 ENCOUNTER — Encounter (HOSPITAL_COMMUNITY)
Admission: EM | Disposition: A | Discharge status: Home / Self Care | Payer: Self-pay | Source: Home / Self Care | Attending: Orthopedic Surgery

## 2017-02-12 ENCOUNTER — Inpatient Hospital Stay (HOSPITAL_COMMUNITY)
Admission: EM | Admit: 2017-02-12 | Discharge: 2017-02-15 | DRG: 494 | Disposition: A | Discharge status: Home / Self Care | Payer: 59 | Attending: Orthopedic Surgery | Admitting: Orthopedic Surgery

## 2017-02-12 DIAGNOSIS — S82452A Displaced comminuted fracture of shaft of left fibula, initial encounter for closed fracture: Principal | ICD-10-CM | POA: Diagnosis present

## 2017-02-12 DIAGNOSIS — S82202A Unspecified fracture of shaft of left tibia, initial encounter for closed fracture: Secondary | ICD-10-CM

## 2017-02-12 DIAGNOSIS — I10 Essential (primary) hypertension: Secondary | ICD-10-CM | POA: Diagnosis present

## 2017-02-12 DIAGNOSIS — R402142 Coma scale, eyes open, spontaneous, at arrival to emergency department: Secondary | ICD-10-CM | POA: Diagnosis present

## 2017-02-12 DIAGNOSIS — S82102A Unspecified fracture of upper end of left tibia, initial encounter for closed fracture: Secondary | ICD-10-CM | POA: Diagnosis present

## 2017-02-12 DIAGNOSIS — R402362 Coma scale, best motor response, obeys commands, at arrival to emergency department: Secondary | ICD-10-CM | POA: Diagnosis present

## 2017-02-12 DIAGNOSIS — Z880 Allergy status to penicillin: Secondary | ICD-10-CM

## 2017-02-12 DIAGNOSIS — F17210 Nicotine dependence, cigarettes, uncomplicated: Secondary | ICD-10-CM | POA: Diagnosis present

## 2017-02-12 DIAGNOSIS — R402252 Coma scale, best verbal response, oriented, at arrival to emergency department: Secondary | ICD-10-CM | POA: Diagnosis present

## 2017-02-12 DIAGNOSIS — S82252A Displaced comminuted fracture of shaft of left tibia, initial encounter for closed fracture: Secondary | ICD-10-CM | POA: Diagnosis present

## 2017-02-12 DIAGNOSIS — S82832A Other fracture of upper and lower end of left fibula, initial encounter for closed fracture: Secondary | ICD-10-CM | POA: Diagnosis present

## 2017-02-12 DIAGNOSIS — Z419 Encounter for procedure for purposes other than remedying health state, unspecified: Secondary | ICD-10-CM

## 2017-02-12 DIAGNOSIS — M79605 Pain in left leg: Secondary | ICD-10-CM | POA: Diagnosis not present

## 2017-02-12 HISTORY — PX: TIBIA IM NAIL INSERTION: SHX2516

## 2017-02-12 HISTORY — PX: IM NAILING TIBIA: SUR734

## 2017-02-12 LAB — URINALYSIS, ROUTINE W REFLEX MICROSCOPIC
Bacteria, UA: NONE SEEN
Bilirubin Urine: NEGATIVE
GLUCOSE, UA: NEGATIVE mg/dL
HGB URINE DIPSTICK: NEGATIVE
KETONES UR: NEGATIVE mg/dL
Nitrite: NEGATIVE
PH: 5 (ref 5.0–8.0)
Protein, ur: NEGATIVE mg/dL
Specific Gravity, Urine: 1.006 (ref 1.005–1.030)
Squamous Epithelial / LPF: NONE SEEN

## 2017-02-12 LAB — ETHANOL: ALCOHOL ETHYL (B): 73 mg/dL — AB (ref <10)

## 2017-02-12 LAB — CBC
HCT: 33.7 % — ABNORMAL LOW (ref 39.0–52.0)
Hemoglobin: 11.5 g/dL — ABNORMAL LOW (ref 13.0–17.0)
MCH: 32 pg (ref 26.0–34.0)
MCHC: 34.1 g/dL (ref 30.0–36.0)
MCV: 93.9 fL (ref 78.0–100.0)
PLATELETS: 138 10*3/uL — AB (ref 150–400)
RBC: 3.59 MIL/uL — AB (ref 4.22–5.81)
RDW: 11.9 % (ref 11.5–15.5)
WBC: 4.8 10*3/uL (ref 4.0–10.5)

## 2017-02-12 LAB — COMPREHENSIVE METABOLIC PANEL
ALBUMIN: 3.4 g/dL — AB (ref 3.5–5.0)
ALK PHOS: 51 U/L (ref 38–126)
ALT: 15 U/L — AB (ref 17–63)
ANION GAP: 8 (ref 5–15)
AST: 22 U/L (ref 15–41)
BILIRUBIN TOTAL: 0.4 mg/dL (ref 0.3–1.2)
BUN: 10 mg/dL (ref 6–20)
CALCIUM: 8 mg/dL — AB (ref 8.9–10.3)
CO2: 22 mmol/L (ref 22–32)
CREATININE: 0.86 mg/dL (ref 0.61–1.24)
Chloride: 107 mmol/L (ref 101–111)
Glucose, Bld: 91 mg/dL (ref 65–99)
Potassium: 3.6 mmol/L (ref 3.5–5.1)
Sodium: 137 mmol/L (ref 135–145)
TOTAL PROTEIN: 5.2 g/dL — AB (ref 6.5–8.1)

## 2017-02-12 LAB — PROTIME-INR
INR: 1.12
Prothrombin Time: 14.3 seconds (ref 11.4–15.2)

## 2017-02-12 LAB — CDS SEROLOGY

## 2017-02-12 LAB — I-STAT CHEM 8, ED
BUN: 10 mg/dL (ref 6–20)
Calcium, Ion: 1.11 mmol/L — ABNORMAL LOW (ref 1.15–1.40)
Chloride: 103 mmol/L (ref 101–111)
Creatinine, Ser: 0.9 mg/dL (ref 0.61–1.24)
Glucose, Bld: 88 mg/dL (ref 65–99)
HEMATOCRIT: 31 % — AB (ref 39.0–52.0)
HEMOGLOBIN: 10.5 g/dL — AB (ref 13.0–17.0)
Potassium: 3.6 mmol/L (ref 3.5–5.1)
SODIUM: 139 mmol/L (ref 135–145)
TCO2: 24 mmol/L (ref 22–32)

## 2017-02-12 LAB — I-STAT CG4 LACTIC ACID, ED: Lactic Acid, Venous: 1.69 mmol/L (ref 0.5–1.9)

## 2017-02-12 SURGERY — INSERTION, INTRAMEDULLARY ROD, TIBIA
Anesthesia: General | Site: Leg Lower | Laterality: Left

## 2017-02-12 MED ORDER — PROMETHAZINE HCL 25 MG/ML IJ SOLN: 6.2500 mg | INTRAMUSCULAR | Status: DC | PRN

## 2017-02-12 MED ORDER — SUFENTANIL CITRATE 50 MCG/ML IV SOLN
INTRAVENOUS | Status: AC
  Filled 2017-02-12: qty 1

## 2017-02-12 MED ORDER — MIDAZOLAM HCL 2 MG/2ML IJ SOLN
INTRAMUSCULAR | Status: AC
  Filled 2017-02-12: qty 2

## 2017-02-12 MED ORDER — MORPHINE SULFATE (PF) 4 MG/ML IV SOLN
4.0000 mg | Freq: Once | INTRAVENOUS | Status: AC
  Administered 2017-02-12: 4 mg via INTRAVENOUS
  Filled 2017-02-12: qty 1

## 2017-02-12 MED ORDER — ONDANSETRON HCL 4 MG/2ML IJ SOLN
INTRAMUSCULAR | Status: AC
  Filled 2017-02-12: qty 2

## 2017-02-12 MED ORDER — SODIUM CHLORIDE 0.9 % IJ SOLN
INTRAMUSCULAR | Status: AC
  Filled 2017-02-12: qty 10

## 2017-02-12 MED ORDER — SUFENTANIL CITRATE 50 MCG/ML IV SOLN
INTRAVENOUS | Status: DC | PRN
  Administered 2017-02-12 (×2): 10 ug via INTRAVENOUS

## 2017-02-12 MED ORDER — PROPOFOL 10 MG/ML IV BOLUS
INTRAVENOUS | Status: DC | PRN
  Administered 2017-02-12: 150 mg via INTRAVENOUS

## 2017-02-12 MED ORDER — CEFAZOLIN SODIUM-DEXTROSE 2-3 GM-%(50ML) IV SOLR
INTRAVENOUS | Status: DC | PRN
  Administered 2017-02-12: 2 g via INTRAVENOUS

## 2017-02-12 MED ORDER — LACTATED RINGERS IV SOLN
INTRAVENOUS | Status: DC | PRN
  Administered 2017-02-12 (×2): via INTRAVENOUS

## 2017-02-12 MED ORDER — SUCCINYLCHOLINE CHLORIDE 200 MG/10ML IV SOSY
PREFILLED_SYRINGE | INTRAVENOUS | Status: DC | PRN
  Administered 2017-02-12: 100 mg via INTRAVENOUS

## 2017-02-12 MED ORDER — LIDOCAINE 2% (20 MG/ML) 5 ML SYRINGE
INTRAMUSCULAR | Status: DC | PRN
  Administered 2017-02-12: 100 mg via INTRAVENOUS

## 2017-02-12 MED ORDER — PROPOFOL 10 MG/ML IV BOLUS
INTRAVENOUS | Status: AC
  Filled 2017-02-12: qty 20

## 2017-02-12 MED ORDER — DEXAMETHASONE SODIUM PHOSPHATE 10 MG/ML IJ SOLN
INTRAMUSCULAR | Status: AC
  Filled 2017-02-12: qty 1

## 2017-02-12 MED ORDER — SUCCINYLCHOLINE CHLORIDE 200 MG/10ML IV SOSY
PREFILLED_SYRINGE | INTRAVENOUS | Status: AC
  Filled 2017-02-12: qty 10

## 2017-02-12 MED ORDER — LIDOCAINE 2% (20 MG/ML) 5 ML SYRINGE
INTRAMUSCULAR | Status: AC
  Filled 2017-02-12: qty 5

## 2017-02-12 MED ORDER — DEXAMETHASONE SODIUM PHOSPHATE 10 MG/ML IJ SOLN
INTRAMUSCULAR | Status: DC | PRN
  Administered 2017-02-12: 10 mg via INTRAVENOUS

## 2017-02-12 MED ORDER — 0.9 % SODIUM CHLORIDE (POUR BTL) OPTIME
TOPICAL | Status: DC | PRN
  Administered 2017-02-12: 1000 mL

## 2017-02-12 MED ORDER — MIDAZOLAM HCL 5 MG/5ML IJ SOLN
INTRAMUSCULAR | Status: DC | PRN
  Administered 2017-02-12: 2 mg via INTRAVENOUS

## 2017-02-12 MED ORDER — HYDROMORPHONE HCL 1 MG/ML IJ SOLN
0.2500 mg | INTRAMUSCULAR | Status: DC | PRN
  Administered 2017-02-13 (×2): 0.5 mg via INTRAVENOUS

## 2017-02-12 MED ORDER — CEFAZOLIN SODIUM-DEXTROSE 2-4 GM/100ML-% IV SOLN
INTRAVENOUS | Status: AC
  Administered 2017-02-12: 22:00:00
  Filled 2017-02-12: qty 100

## 2017-02-12 SURGICAL SUPPLY — 53 items
BANDAGE ACE 4X5 VEL STRL LF (GAUZE/BANDAGES/DRESSINGS) ×3 IMPLANT
BANDAGE ACE 6X5 VEL STRL LF (GAUZE/BANDAGES/DRESSINGS) ×3 IMPLANT
BANDAGE ESMARK 6X9 LF (GAUZE/BANDAGES/DRESSINGS) ×1 IMPLANT
BIT DRILL CALIBRATED 4.3X320MM (BIT) ×1 IMPLANT
BIT DRILL CROWE POINT TWST 4.3 (DRILL) ×2 IMPLANT
BNDG ESMARK 6X9 LF (GAUZE/BANDAGES/DRESSINGS) ×3
COVER MAYO STAND STRL (DRAPES) ×3 IMPLANT
COVER SURGICAL LIGHT HANDLE (MISCELLANEOUS) ×3 IMPLANT
CUFF TOURNIQUET SINGLE 34IN LL (TOURNIQUET CUFF) IMPLANT
DRAPE C-ARM 42X72 X-RAY (DRAPES) ×3 IMPLANT
DRAPE C-ARMOR (DRAPES) ×3 IMPLANT
DRAPE HALF SHEET 40X57 (DRAPES) ×3 IMPLANT
DRAPE IMP U-DRAPE 54X76 (DRAPES) IMPLANT
DRAPE POUCH INSTRU U-SHP 10X18 (DRAPES) ×3 IMPLANT
DRAPE U-SHAPE 47X51 STRL (DRAPES) ×3 IMPLANT
DRAPE UTILITY XL STRL (DRAPES) IMPLANT
DRILL CALIBRATED 4.3X320MM (BIT) ×3
DRILL CROWE POINT TWIST 4.3 (DRILL) ×6
DURAPREP 26ML APPLICATOR (WOUND CARE) ×3 IMPLANT
ELECT CAUTERY BLADE 6.4 (BLADE) ×3 IMPLANT
ELECT REM PT RETURN 9FT ADLT (ELECTROSURGICAL) ×3
ELECTRODE REM PT RTRN 9FT ADLT (ELECTROSURGICAL) ×1 IMPLANT
FACESHIELD WRAPAROUND (MASK) IMPLANT
GAUZE SPONGE 4X4 12PLY STRL (GAUZE/BANDAGES/DRESSINGS) ×3 IMPLANT
GAUZE XEROFORM 1X8 LF (GAUZE/BANDAGES/DRESSINGS) ×6 IMPLANT
GLOVE BIO SURGEON STRL SZ7.5 (GLOVE) ×3 IMPLANT
GLOVE BIOGEL PI IND STRL 8 (GLOVE) ×1 IMPLANT
GLOVE BIOGEL PI INDICATOR 8 (GLOVE) ×2
GOWN STRL REUS W/ TWL LRG LVL3 (GOWN DISPOSABLE) ×2 IMPLANT
GOWN STRL REUS W/ TWL XL LVL3 (GOWN DISPOSABLE) ×1 IMPLANT
GOWN STRL REUS W/TWL LRG LVL3 (GOWN DISPOSABLE) ×4
GOWN STRL REUS W/TWL XL LVL3 (GOWN DISPOSABLE) ×2
GUIDEPIN 3.2X17.5 THRD DISP (PIN) ×3 IMPLANT
GUIDEWIRE 2.6X80 BEAD TIP (WIRE) ×1 IMPLANT
GUIDWIRE 2.6X80 BEAD TIP (WIRE) ×3
KIT BASIN OR (CUSTOM PROCEDURE TRAY) ×3 IMPLANT
MANIFOLD NEPTUNE II (INSTRUMENTS) ×3 IMPLANT
NAIL TIBIAL PHOENIX 9.0X320MM (Nail) ×3 IMPLANT
NS IRRIG 1000ML POUR BTL (IV SOLUTION) ×3 IMPLANT
PACK TOTAL JOINT (CUSTOM PROCEDURE TRAY) ×3 IMPLANT
PACK UNIVERSAL I (CUSTOM PROCEDURE TRAY) ×3 IMPLANT
PAD CAST 4YDX4 CTTN HI CHSV (CAST SUPPLIES) ×2 IMPLANT
PADDING CAST COTTON 4X4 STRL (CAST SUPPLIES) ×4
SCREW CORT TI DBL LEAD 5X34 (Screw) ×3 IMPLANT
SCREW CORT TI DBL LEAD 5X40 (Screw) ×3 IMPLANT
SCREW CORT TI DBL LEAD 5X42 (Screw) ×3 IMPLANT
SCREW CORT TI DBL LEAD 5X50 (Screw) ×3 IMPLANT
STAPLER SKIN PROX WIDE 3.9 (STAPLE) ×3 IMPLANT
SUT MON AB 2-0 CT1 36 (SUTURE) ×3 IMPLANT
SUT VIC AB 0 CT1 27 (SUTURE) ×2
SUT VIC AB 0 CT1 27XBRD ANBCTR (SUTURE) ×1 IMPLANT
TOWEL OR 17X26 10 PK STRL BLUE (TOWEL DISPOSABLE) ×6 IMPLANT
WATER STERILE IRR 1000ML POUR (IV SOLUTION) ×3 IMPLANT

## 2017-02-12 NOTE — ED Notes (Signed)
Ortho tech placed a posterior splint on the pts lt lower leg
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
## 2017-02-12 NOTE — ED Notes (Signed)
The chaplain has called the pts daughter  shes coming from [place]

## 2017-02-12 NOTE — ED Notes (Signed)
Portable femur lt  tib fin lt  Chest pelvis
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
## 2017-02-12 NOTE — ED Notes (Signed)
Received call from OR stating to bring pt right now.  Informed OR staff that this writer was unaware that pt was going to surgery at this time and that OR checklist has not been completed.  OR staff member stated to bring pt to OR w/o checklist being completed.
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
## 2017-02-12 NOTE — ED Notes (Signed)
Pt gave his daughter Joice Loftsmber permission to take all of his belongings.  Including cell phone, clothes, and wallet.

## 2017-02-12 NOTE — Progress Notes (Signed)
Orthopedic Tech Progress Note Patient Details:  Alesia RichardsGary P Mcdermott 03/29/1965 [id number]  Ortho Devices Type of Ortho Device: Ace wrap, Post (long leg) splint Ortho Device/Splint Location: LLE Ortho Device/Splint Interventions: Ordered, Application   Post Interventions Patient Tolerated: Well Instructions Provided: Care of device   Jennye MoccasinHughes, [place] 02/12/2017, 8:57 PM
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
## 2017-02-12 NOTE — ED Notes (Signed)
Ortho tech is aware of need for splint.

## 2017-02-12 NOTE — Anesthesia Preprocedure Evaluation (Signed)
Anesthesia Evaluation  Patient identified by MRN, date of birth, ID band Patient awake    Reviewed: Allergy & Precautions, NPO status , Patient's Chart, lab work & pertinent test results  Airway Mallampati: II  TM Distance: >3 FB Neck ROM: Full    Dental  (+) Dental Advisory Given   Pulmonary Current Smoker,    breath sounds clear to auscultation       Cardiovascular hypertension, Pt. on medications  Rhythm:Regular Rate:Normal     Neuro/Psych negative neurological ROS     GI/Hepatic negative GI ROS, Neg liver ROS,   Endo/Other  negative endocrine ROS  Renal/GU negative Renal ROS     Musculoskeletal Tibia fx   Abdominal   Peds  Hematology  (+) anemia ,   Anesthesia Other Findings   Reproductive/Obstetrics                             Lab Results  Component Value Date   WBC 4.8 02/12/2017   HGB 10.5 (L) 02/12/2017   HCT 31.0 (L) 02/12/2017   MCV 93.9 02/12/2017   PLT 138 (L) 02/12/2017   Lab Results  Component Value Date   CREATININE 0.90 02/12/2017   BUN 10 02/12/2017   NA 139 02/12/2017   K 3.6 02/12/2017   CL 103 02/12/2017   CO2 22 02/12/2017    Anesthesia Physical Anesthesia Plan  ASA: III and emergent  Anesthesia Plan: General   Post-op Pain Management:    Induction: Intravenous and Rapid sequence  PONV Risk Score and Plan: 1 and Ondansetron, Dexamethasone and Treatment may vary due to age or medical condition  Airway Management Planned: Oral ETT  Additional Equipment:   Intra-op Plan:   Post-operative Plan: Extubation in OR  Informed Consent: I have reviewed the patients History and Physical, chart, labs and discussed the procedure including the risks, benefits and alternatives for the proposed anesthesia with the patient or authorized representative who has indicated his/her understanding and acceptance.   Dental advisory given  Plan Discussed with:    Anesthesia Plan Comments:         Anesthesia Quick Evaluation

## 2017-02-13 ENCOUNTER — Inpatient Hospital Stay (HOSPITAL_COMMUNITY): Payer: 59

## 2017-02-13 ENCOUNTER — Emergency Department (HOSPITAL_COMMUNITY): Payer: 59

## 2017-02-13 DIAGNOSIS — M79605 Pain in left leg: Secondary | ICD-10-CM | POA: Diagnosis present

## 2017-02-13 DIAGNOSIS — R402362 Coma scale, best motor response, obeys commands, at arrival to emergency department: Secondary | ICD-10-CM | POA: Diagnosis present

## 2017-02-13 DIAGNOSIS — Z880 Allergy status to penicillin: Secondary | ICD-10-CM | POA: Diagnosis not present

## 2017-02-13 DIAGNOSIS — F17210 Nicotine dependence, cigarettes, uncomplicated: Secondary | ICD-10-CM | POA: Diagnosis present

## 2017-02-13 DIAGNOSIS — S82252A Displaced comminuted fracture of shaft of left tibia, initial encounter for closed fracture: Secondary | ICD-10-CM | POA: Diagnosis present

## 2017-02-13 DIAGNOSIS — S82102A Unspecified fracture of upper end of left tibia, initial encounter for closed fracture: Secondary | ICD-10-CM | POA: Diagnosis present

## 2017-02-13 DIAGNOSIS — I10 Essential (primary) hypertension: Secondary | ICD-10-CM | POA: Diagnosis present

## 2017-02-13 DIAGNOSIS — S82202A Unspecified fracture of shaft of left tibia, initial encounter for closed fracture: Secondary | ICD-10-CM | POA: Diagnosis present

## 2017-02-13 DIAGNOSIS — S82452A Displaced comminuted fracture of shaft of left fibula, initial encounter for closed fracture: Secondary | ICD-10-CM | POA: Diagnosis present

## 2017-02-13 DIAGNOSIS — R402142 Coma scale, eyes open, spontaneous, at arrival to emergency department: Secondary | ICD-10-CM | POA: Diagnosis present

## 2017-02-13 DIAGNOSIS — R402252 Coma scale, best verbal response, oriented, at arrival to emergency department: Secondary | ICD-10-CM | POA: Diagnosis present

## 2017-02-13 DIAGNOSIS — S82832A Other fracture of upper and lower end of left fibula, initial encounter for closed fracture: Secondary | ICD-10-CM

## 2017-02-13 LAB — SAMPLE TO BLOOD BANK

## 2017-02-13 LAB — CBC
HEMATOCRIT: 28.4 % — AB (ref 39.0–52.0)
Hemoglobin: 9.7 g/dL — ABNORMAL LOW (ref 13.0–17.0)
MCH: 31.7 pg (ref 26.0–34.0)
MCHC: 34.2 g/dL (ref 30.0–36.0)
MCV: 92.8 fL (ref 78.0–100.0)
Platelets: 146 10*3/uL — ABNORMAL LOW (ref 150–400)
RBC: 3.06 MIL/uL — ABNORMAL LOW (ref 4.22–5.81)
RDW: 12 % (ref 11.5–15.5)
WBC: 6.8 10*3/uL (ref 4.0–10.5)

## 2017-02-13 LAB — CREATININE, SERUM
Creatinine, Ser: 0.9 mg/dL (ref 0.61–1.24)
GFR calc Af Amer: >60 mL/min (ref >60)
GFR calc non Af Amer: >60 mL/min (ref >60)

## 2017-02-13 MED ORDER — ONDANSETRON HCL 4 MG PO TABS: 4.0000 mg | ORAL_TABLET | Freq: Four times a day (QID) | ORAL | Status: DC | PRN

## 2017-02-13 MED ORDER — OXYCODONE HCL 5 MG PO TABS
5.0000 mg | ORAL_TABLET | ORAL | Status: DC | PRN
  Administered 2017-02-13 (×3): 10 mg via ORAL
  Administered 2017-02-14: 5 mg via ORAL
  Administered 2017-02-14: 10 mg via ORAL
  Administered 2017-02-14 (×3): 5 mg via ORAL
  Administered 2017-02-15 (×5): 10 mg via ORAL
  Filled 2017-02-13 (×5): qty 2
  Filled 2017-02-13 (×2): qty 1
  Filled 2017-02-13 (×5): qty 2
  Filled 2017-02-13: qty 1

## 2017-02-13 MED ORDER — ENOXAPARIN SODIUM 40 MG/0.4ML ~~LOC~~ SOLN
40.0000 mg | SUBCUTANEOUS | Status: DC
  Administered 2017-02-13 – 2017-02-15 (×3): 40 mg via SUBCUTANEOUS
  Filled 2017-02-13 (×3): qty 0.4

## 2017-02-13 MED ORDER — KETOROLAC TROMETHAMINE 15 MG/ML IJ SOLN
15.0000 mg | Freq: Four times a day (QID) | INTRAMUSCULAR | Status: AC
Start: 2017-02-13 — End: 2017-02-14
  Administered 2017-02-13 – 2017-02-14 (×5): 15 mg via INTRAVENOUS
  Filled 2017-02-13 (×5): qty 1

## 2017-02-13 MED ORDER — CEFAZOLIN SODIUM-DEXTROSE 1-4 GM/50ML-% IV SOLN
1.0000 g | Freq: Three times a day (TID) | INTRAVENOUS | Status: AC
Start: 2017-02-13 — End: 2017-02-13
  Administered 2017-02-13 (×3): 1 g via INTRAVENOUS
  Filled 2017-02-13 (×3): qty 50

## 2017-02-13 MED ORDER — ENOXAPARIN SODIUM 40 MG/0.4ML ~~LOC~~ SOLN: 40.0000 mg | SUBCUTANEOUS | 0 refills | Status: AC

## 2017-02-13 MED ORDER — ONDANSETRON HCL 4 MG/2ML IJ SOLN
INTRAMUSCULAR | Status: DC | PRN
Start: 2017-02-13 — End: 2017-02-13
  Administered 2017-02-13: 4 mg via INTRAVENOUS

## 2017-02-13 MED ORDER — DEXTROSE 5 % IV SOLN
500.0000 mg | Freq: Four times a day (QID) | INTRAVENOUS | Status: DC | PRN
  Filled 2017-02-13: qty 5

## 2017-02-13 MED ORDER — ONDANSETRON 4 MG PO TBDP: 4.0000 mg | ORAL_TABLET | Freq: Three times a day (TID) | ORAL | 0 refills | Status: AC | PRN

## 2017-02-13 MED ORDER — SENNA 8.6 MG PO TABS
1.0000 | ORAL_TABLET | Freq: Two times a day (BID) | ORAL | Status: DC
  Administered 2017-02-13 – 2017-02-14 (×4): 8.6 mg via ORAL
  Filled 2017-02-13 (×5): qty 1

## 2017-02-13 MED ORDER — ONDANSETRON HCL 4 MG/2ML IJ SOLN: 4.0000 mg | Freq: Four times a day (QID) | INTRAMUSCULAR | Status: DC | PRN

## 2017-02-13 MED ORDER — ACETAMINOPHEN 325 MG PO TABS
650.0000 mg | ORAL_TABLET | Freq: Four times a day (QID) | ORAL | Status: DC | PRN
  Administered 2017-02-14 – 2017-02-15 (×3): 650 mg via ORAL
  Filled 2017-02-13 (×3): qty 2

## 2017-02-13 MED ORDER — LISINOPRIL 10 MG PO TABS
10.0000 mg | ORAL_TABLET | Freq: Every day | ORAL | Status: DC
Start: 2017-02-14 — End: 2017-02-15
  Filled 2017-02-13 (×2): qty 1

## 2017-02-13 MED ORDER — HYDROMORPHONE HCL 1 MG/ML IJ SOLN
INTRAMUSCULAR | Status: AC
  Administered 2017-02-13: 0.5 mg via INTRAVENOUS
  Filled 2017-02-13: qty 1

## 2017-02-13 MED ORDER — BISACODYL 5 MG PO TBEC: 5.0000 mg | DELAYED_RELEASE_TABLET | Freq: Every day | ORAL | Status: DC | PRN

## 2017-02-13 MED ORDER — METHOCARBAMOL 500 MG PO TABS
500.0000 mg | ORAL_TABLET | Freq: Four times a day (QID) | ORAL | Status: DC | PRN
Start: 2017-02-13 — End: 2017-02-15
  Administered 2017-02-14 – 2017-02-15 (×3): 500 mg via ORAL
  Filled 2017-02-13 (×3): qty 1

## 2017-02-13 MED ORDER — ACETAMINOPHEN 650 MG RE SUPP: 650.0000 mg | Freq: Four times a day (QID) | RECTAL | Status: DC | PRN

## 2017-02-13 MED ORDER — HYDROMORPHONE HCL 2 MG PO TABS: 2.0000 mg | ORAL_TABLET | ORAL | 0 refills | Status: AC | PRN

## 2017-02-13 MED ORDER — HYDROMORPHONE HCL 1 MG/ML IJ SOLN
1.0000 mg | INTRAMUSCULAR | Status: DC | PRN
  Administered 2017-02-13 – 2017-02-14 (×2): 1 mg via INTRAVENOUS
  Filled 2017-02-13 (×3): qty 1

## 2017-02-13 NOTE — Progress Notes (Signed)
Pt iv slid out of his hand, RN disposed of the IV catheter intact. RN was about to give patient dilaudid but did pt did not receive due to iv being out. IV team consulted when IV is reinserted Pt can receive IV dilaudid

## 2017-02-13 NOTE — Anesthesia Procedure Notes (Signed)
Procedure Name: Intubation Date/Time: 02/12/2017 10:58 PM Performed by: Claris Che, CRNA Pre-anesthesia Checklist: Patient identified, Emergency Drugs available, Suction available, Patient being monitored and Timeout performed Patient Re-evaluated:Patient Re-evaluated prior to induction Oxygen Delivery Method: Circle system utilized Preoxygenation: Pre-oxygenation with 100% oxygen Induction Type: IV induction, Rapid sequence and Cricoid Pressure applied Laryngoscope Size: Mac and 3 Grade View: Grade III Tube type: Oral Tube size: 8.0 mm Number of attempts: 1 Airway Equipment and Method: Stylet Placement Confirmation: ETT inserted through vocal cords under direct vision,  positive ETCO2 and breath sounds checked- equal and bilateral Secured at: 23 cm Tube secured with: Tape Dental Injury: Teeth and Oropharynx as per pre-operative assessment

## 2017-02-13 NOTE — Anesthesia Postprocedure Evaluation (Signed)
Anesthesia Post Note  Patient: Devin Lewis  Procedure(s) Performed: INTRAMEDULLARY (IM) NAIL TIBIAL, LEFT (Left Leg Lower)     Anesthesia Post Evaluation  Last Vitals:  Vitals:   02/12/17 2045 02/12/17 2145  BP: 96/63 (!) 90/59  Pulse: 67 76  Resp: 11 (!) 23  Temp:    SpO2: 97% 95%    Last Pain:  Vitals:   02/12/17 2013  PainSc: 8                  Trinika Cortese
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
## 2017-02-13 NOTE — Discharge Instructions (Signed)
Orthopedic discharge instructions:  -Maintain fracture boot to left lower extremity at all times, including sleep.  No weightbearing to the left leg.  -Keep wounds covered with clean dry dressing as needed.  Do not get wounds wet until you have been seen by Dr. Aundria Rudogers in follow-up.  -For prevention of blood clots administer 1 Lovenox injection per day for 4 weeks.  -Elevate extremity with toes above nose as much throughout the day as possible.  -Return to the clinic to see Dr. Aundria Rudogers in 2 weeks for postoperative check and follow-up.
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
## 2017-02-13 NOTE — Progress Notes (Signed)
Subjective: 1 Day Post-Op Procedure(s) (LRB): INTRAMEDULLARY (IM) NAIL TIBIAL, LEFT (Left) Patient reports pain as moderate.  Sitting up comfortably in recliner  Objective: Vital signs in last 24 hours: Temp:  [97.5 F (36.4 C)-98.9 F (37.2 C)] 98.3 F (36.8 C) (12/09 0520) Pulse Rate:  [62-118] 66 (12/09 0520) Resp:  [11-23] 16 (12/09 0520) BP: (89-157)/(55-105) 126/69 (12/09 0520) SpO2:  [95 %-100 %] 100 % (12/09 0520) Weight:  [140 lb (63.5 kg)] 140 lb (63.5 kg) (12/08 1852)  Intake/Output from previous day: 12/08 0701 - 12/09 0700 In: 1500 [I.V.:1500] Out: 575 [Urine:475; Blood:100] Intake/Output this shift: Total I/O In: 240 [P.O.:240] Out: -   Recent Labs    02/12/17 1830 02/12/17 1843 02/13/17 0524  HGB 11.5* 10.5* 9.7*   Recent Labs    02/12/17 1830 02/12/17 1843 02/13/17 0524  WBC 4.8  --  6.8  RBC 3.59*  --  3.06*  HCT 33.7* 31.0* 28.4*  PLT 138*  --  146*   Recent Labs    02/12/17 1830 02/12/17 1843 02/13/17 0524  NA 137 139  --   K 3.6 3.6  --   CL 107 103  --   CO2 22  --   --   BUN 10 10  --   CREATININE 0.86 0.90 0.90  GLUCOSE 91 88  --   CALCIUM 8.0*  --   --    Recent Labs    02/12/17 1830  INR 1.12    Neurovascular intact Compartment soft Dressing intact-no drainage  Assessment/Plan: 1 Day Post-Op Procedure(s) (LRB): INTRAMEDULLARY (IM) NAIL TIBIAL, LEFT (Left) Advance diet Up with therapy  Homero FellersFrank Emmaus Brandi 02/13/2017, 1:33 PM
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
## 2017-02-13 NOTE — Brief Op Note (Signed)
02/12/2017 - 02/13/2017  12:34 AM  PATIENT:  Devin Lewis  51 y.o. male  PRE-OPERATIVE DIAGNOSIS:  LEFT TIBIA FRACTURE  POST-OPERATIVE DIAGNOSIS:  LEFT TIBIA FRACTURE  PROCEDURE:  Procedure(s): INTRAMEDULLARY (IM) NAIL TIBIAL, LEFT (Left)  SURGEON:  Surgeon(s) and Role:    * Aundria Rudogers, Noah DelaineJason Patrick, MD - Primary  PHYSICIAN ASSISTANT:   ASSISTANTS: none   ANESTHESIA:   general  EBL:  150 cc  BLOOD ADMINISTERED:none  DRAINS: none   LOCAL MEDICATIONS USED:  NONE  SPECIMEN:  No Specimen  DISPOSITION OF SPECIMEN:  N/A  COUNTS:  YES  TOURNIQUET:  * No tourniquets in log *  DICTATION: .Note written in EPIC  PLAN OF CARE: Admit to inpatient   PATIENT DISPOSITION:  PACU - hemodynamically stable.   Delay start of Pharmacological VTE agent (>24hrs) due to surgical blood loss or risk of bleeding: not applicable

## 2017-02-13 NOTE — Progress Notes (Signed)
Orthopedic Tech Progress Note Patient Details:  Devin RichardsGary P Lewis 04/28/1965 [id number]  Ortho Devices Type of Ortho Device: CAM walker Ortho Device/Splint Location: lle Ortho Device/Splint Interventions: Application   Post Interventions Patient Tolerated: Well Instructions Provided: Care of device   [name] 02/13/2017, 10:06 AM

## 2017-02-13 NOTE — Transfer of Care (Signed)
Immediate Anesthesia Transfer of Care Note  Patient: Devin Lewis  Procedure(s) Performed: INTRAMEDULLARY (IM) NAIL TIBIAL, LEFT (Left Leg Lower)  Patient Location: PACU  Anesthesia Type:General  Level of Consciousness: oriented, drowsy, patient cooperative and responds to stimulation  Airway & Oxygen Therapy: Patient Spontanous Breathing and Patient connected to nasal cannula oxygen  Post-op Assessment: Report given to RN, Post -op Vital signs reviewed and stable and Patient moving all extremities X 4  Post vital signs: Reviewed and stable  Last Vitals:  Vitals:   02/12/17 2045 02/12/17 2145  BP: 96/63 (!) 90/59  Pulse: 67 76  Resp: 11 (!) 23  Temp:    SpO2: 97% 95%    Last Pain:  Vitals:   02/12/17 2013  PainSc: 8       Patients Stated Pain Goal: 6 (02/12/17 1949)  Complications: No apparent anesthesia complications

## 2017-02-14 ENCOUNTER — Encounter (HOSPITAL_COMMUNITY): Payer: Self-pay | Admitting: General Practice

## 2017-02-14 NOTE — Progress Notes (Signed)
Subjective: 2 Days Post-Op Procedure(s) (LRB): INTRAMEDULLARY (IM) NAIL TIBIAL, LEFT (Left) Patient reports pain as moderate.  Sitting up comfortably in recliner, and just finished therapy session.  Pain is better with elevation.  Denies SOB/CP or n/v.  Objective: Vital signs in last 24 hours: Temp:  [98.2 F (36.8 C)-98.8 F (37.1 C)] 98.2 F (36.8 C) (12/10 0600) Pulse Rate:  [62-72] 72 (12/10 0600) Resp:  [16-17] 16 (12/10 0600) BP: (87-105)/(39-54) 99/39 (12/10 1135) SpO2:  [97 %-100 %] 100 % (12/10 0600)  Intake/Output from previous day: 12/09 0701 - 12/10 0700 In: 816 [P.O.:716; IV Piggyback:100] Out: 1 [Urine:1] Intake/Output this shift: Total I/O In: -  Out: 575 [Urine:575]  Recent Labs    02/12/17 1830 02/12/17 1843 02/13/17 0524  HGB 11.5* 10.5* 9.7*   Recent Labs    02/12/17 1830 02/12/17 1843 02/13/17 0524  WBC 4.8  --  6.8  RBC 3.59*  --  3.06*  HCT 33.7* 31.0* 28.4*  PLT 138*  --  146*   Recent Labs    02/12/17 1830 02/12/17 1843 02/13/17 0524  NA 137 139  --   K 3.6 3.6  --   CL 107 103  --   CO2 22  --   --   BUN 10 10  --   CREATININE 0.86 0.90 0.90  GLUCOSE 91 88  --   CALCIUM 8.0*  --   --    Recent Labs    02/12/17 1830  INR 1.12    Neurovascular intact Compartment soft Dressing intact-no drainage  No signs of compartyment syndrome, no pain with PROM or AROM, cap refill < 2 s  Assessment/Plan: 2 Days Post-Op Procedure(s) (LRB): INTRAMEDULLARY (IM) NAIL TIBIAL, LEFT (Left) Advance diet Up with therapy  NWB LLE lovenox for DVT ppx Likely dc home tomorrow.  Yolonda KidaJason Patrick Maykayla Highley 02/14/2017, 12:16 PM
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
## 2017-02-15 ENCOUNTER — Encounter (HOSPITAL_COMMUNITY): Payer: Self-pay | Admitting: Orthopedic Surgery

## 2017-02-15 NOTE — Progress Notes (Signed)
Subjective: 3 Days Post-Op Procedure(s) (LRB): INTRAMEDULLARY (IM) NAIL TIBIAL, LEFT (Left) Patient reports pain as moderate.  Sitting up comfortably in recliner, and just finished therapy session.  Pain is better fair.  Denies SOB/CP or n/v.  Objective: Vital signs in last 24 hours: Temp:  [98.2 F (36.8 C)-98.6 F (37 C)] 98.4 F (36.9 C) (12/11 0455) Pulse Rate:  [75-82] 82 (12/11 0455) Resp:  [16] 16 (12/11 0455) BP: (104-123)/(52-66) 110/54 (12/11 1013) SpO2:  [98 %-100 %] 100 % (12/11 0455)  Intake/Output from previous day: 12/10 0701 - 12/11 0700 In: -  Out: 975 [Urine:975] Intake/Output this shift: Total I/O In: 240 [P.O.:240] Out: -   Recent Labs    02/12/17 1830 02/12/17 1843 02/13/17 0524  HGB 11.5* 10.5* 9.7*   Recent Labs    02/12/17 1830 02/12/17 1843 02/13/17 0524  WBC 4.8  --  6.8  RBC 3.59*  --  3.06*  HCT 33.7* 31.0* 28.4*  PLT 138*  --  146*   Recent Labs    02/12/17 1830 02/12/17 1843 02/13/17 0524  NA 137 139  --   K 3.6 3.6  --   CL 107 103  --   CO2 22  --   --   BUN 10 10  --   CREATININE 0.86 0.90 0.90  GLUCOSE 91 88  --   CALCIUM 8.0*  --   --    Recent Labs    02/12/17 1830  INR 1.12    Neurovascular intact Compartment soft Dressing intact-no drainage  No signs of compartyment syndrome, no pain with PROM or AROM, cap refill < 2 s  Assessment/Plan: 3 Days Post-Op Procedure(s) (LRB): INTRAMEDULLARY (IM) NAIL TIBIAL, LEFT (Left) Advance diet Up with therapy  NWB LLE lovenox for DVT ppx Dc home today Fu 2 weeks  Yolonda KidaJason Patrick Coley Kulikowski 02/15/2017, 11:51 AM

## 2017-02-15 NOTE — Progress Notes (Signed)
Patient has met discharge criteria at this time. Discharge instructions discussed with patient and family.  Patient and family verbalized understanding of instructions.  Paper prescriptions given to patient for following medications: Lovenox, Zofran, Dilaudid . Equipment given to patient in room included walker, wheelchair, and 3 in 1.  Patient instructed to call office to schedule follow up appointment with surgeon. Denies any further questions or concerns.  Patient discharged home with family via wheelchair.  Instructed to call office with any further questions or concerns.
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
## 2017-02-15 NOTE — Care Management Note (Signed)
Case Management Note  Patient Details  Name: Devin Lewis MRN: [id number] Date of Birth: 01/05/1966  Subjective/Objective:                    Action/Plan: Case manager ordered DME. No HH needs.    Expected Discharge Date:  02/15/17               Expected Discharge Plan:  Home/Self Care  In-House Referral:  NA  Discharge planning Services  CM Consult  Post Acute Care Choice:  Durable Medical Equipment Choice offered to:  NA  DME Arranged:  3-N-1, Walker rolling, Wheelchair manual DME Agency:  [facility]  HH Arranged:  NA HH Agency:  NA  Status of Service:  Completed, signed off  If discussed at [employer] Length of Stay Meetings, dates discussed:    Additional Comments:  Durenda GuthrieBrady, Kamaiyah Uselton Naomi, RN 02/15/2017, 2:41 PM

## 2017-02-15 NOTE — Progress Notes (Signed)
Patient suffers from impaired gait and functional mobility due to weight bearing status which impairs their ability to perform daily activities like mobilizing in and out of the home. A walker alone will not resolve the issues with performing activities of daily living. A wheelchair will allow patient to safely perform daily activities. The patient can self propel in the home or has a caregiver who can provide assistance.   Devin Lewis, PTA Pager: [phone]

## 2017-02-16 LAB — HIV ANTIBODY (ROUTINE TESTING W REFLEX): HIV SCREEN 4TH GENERATION: NONREACTIVE
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
## 2022-07-28 ENCOUNTER — Other Ambulatory Visit: Payer: Self-pay

## 2022-07-28 ENCOUNTER — Emergency Department
Admission: EM | Admit: 2022-07-28 | Discharge: 2022-07-28 | Disposition: A | Discharge status: Home / Self Care | Payer: 59 | Attending: Emergency Medicine | Admitting: Emergency Medicine

## 2022-07-28 DIAGNOSIS — I10 Essential (primary) hypertension: Secondary | ICD-10-CM | POA: Insufficient documentation

## 2022-07-28 DIAGNOSIS — M79631 Pain in right forearm: Secondary | ICD-10-CM | POA: Diagnosis present

## 2022-07-28 DIAGNOSIS — L03113 Cellulitis of right upper limb: Secondary | ICD-10-CM | POA: Diagnosis not present

## 2022-07-28 MED ORDER — SULFAMETHOXAZOLE-TRIMETHOPRIM 800-160 MG PO TABS: 1.0000 | ORAL_TABLET | Freq: Two times a day (BID) | ORAL | 0 refills | Status: AC

## 2022-07-28 NOTE — ED Triage Notes (Signed)
Pt c/o right forearm soreness, itching, redness started yesterday. Denies injuries or insect bites.

## 2022-07-28 NOTE — ED Provider Notes (Signed)
[facility] Provider Note    Event Date/Time   First MD Initiated Contact with Patient 07/28/22 1201     (approximate)   History   Arm Pain   HPI  Devin Lewis is a 57 y.o. male with history of hypertension, alcohol use, pancreatitis and as listed in EMR presents to the emergency department for treatment and evaluation of right forearm soreness, itching, and redness.  Symptoms started yesterday.      Physical Exam   Triage Vital Signs: ED Triage Vitals  Enc Vitals Group     BP 07/28/22 1126 (!) 141/71     Pulse Rate 07/28/22 1126 (!) 59     Resp 07/28/22 1126 16     Temp 07/28/22 1125 98.2 F (36.8 C)     Temp src --      SpO2 07/28/22 1126 97 %     Weight 07/28/22 1126 130 lb (59 kg)     Height 07/28/22 1126 5\' 5"  (1.651 m)     Head Circumference --      Peak Flow --      Pain Score 07/28/22 1126 0     Pain Loc --      Pain Edu? --      Excl. in GC? --     Most recent vital signs: Vitals:   07/28/22 1125 07/28/22 1126  BP:  (!) 141/71  Pulse:  (!) 59  Resp:  16  Temp: 98.2 F (36.8 C)   SpO2:  97%    General: Awake, no distress.  CV:  Good peripheral perfusion.  Resp:  Normal effort.  Abd:  No distention.  Other:  Skin overlying right forearm erythematous with areas of excoriation.    ED Results / Procedures / Treatments   Labs (all labs ordered are listed, but only abnormal results are displayed) Labs Reviewed - No data to display   EKG  Not indicated.   RADIOLOGY  Image and radiology report reviewed and interpreted by me. Radiology report consistent with the same.  Not indicated  PROCEDURES:  Critical Care performed: No  Procedures   MEDICATIONS ORDERED IN ED:  Medications - No data to display   IMPRESSION / MDM / ASSESSMENT AND PLAN / ED COURSE   I have reviewed the triage note.  Differential diagnosis includes, but is not limited to, cellulitis, allergic reaction  Patient's presentation is  most consistent with acute illness / injury with system symptoms.  57 year old male presenting to the emergency department for treatment and evaluation of area of erythema over the right forearm.  No known injury.  On exam, the forearm does appear erythematous and consistent with cellulitis.  Plan will be to treat him with antibiotics and have him monitor the area closely.  If erythema gets worse or if he notes areas of streaking, he is to return to the emergency department if he is unable to see his primary care provider.      FINAL CLINICAL IMPRESSION(S) / ED DIAGNOSES   Final diagnoses:  Cellulitis of right forearm     Rx / DC Orders   ED Discharge Orders          Ordered    sulfamethoxazole-trimethoprim (BACTRIM DS) 800-160 MG tablet  2 times daily        07/28/22 1207             Note:  This document was prepared using Dragon voice recognition software and may include unintentional dictation  errors.   Chinita Pester, FNP 07/28/22 1210    Minna Antis, MD 07/28/22 1442

## 2022-07-28 NOTE — Discharge Instructions (Signed)
Please monitor the area closely.  If the area of redness gets larger or you notice red streaks up or down your arm, please either see your primary care provider or return to the emergency department.
